# Patient Record
Sex: Female | Born: 2000
Health system: Southern US, Community
[De-identification: ages and names within clinical notes are randomized; demographics above are authoritative.]

## PROBLEM LIST (undated history)

## (undated) ENCOUNTER — Ambulatory Visit (HOSPITAL_COMMUNITY): Discharge status: Against Medical Advice | Payer: Medicaid Other

## (undated) DIAGNOSIS — Z789 Other specified health status: Secondary | ICD-10-CM

## (undated) HISTORY — PX: NO PAST SURGERIES: SHX2092

---
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
## 2020-01-11 ENCOUNTER — Ambulatory Visit (INDEPENDENT_AMBULATORY_CARE_PROVIDER_SITE_OTHER): Payer: Medicaid Other | Admitting: Certified Nurse Midwife

## 2020-01-11 ENCOUNTER — Other Ambulatory Visit: Payer: Self-pay

## 2020-01-11 ENCOUNTER — Encounter: Payer: Self-pay | Admitting: Certified Nurse Midwife

## 2020-01-11 NOTE — Progress Notes (Signed)
Post Partum Visit Note  Stephanie Sloan is a 19 y.o. G32P1001 female who presents for a postpartum visit. She is 4 weeks postpartum following a normal spontaneous vaginal delivery.  I have fully reviewed the prenatal and intrapartum course. The delivery was at 39.6 gestational weeks.  Anesthesia: local. Postpartum course has been doing well. Baby is doing well. Baby is feeding by both breast and bottle - gerber gentle. Bleeding staining only. Bowel function is abnormal: constipation. Bladder function is normal. Patient is not sexually active. Contraception method is POP.   Postpartum depression screening: negative, score 2.   The pregnancy intention screening data noted above was reviewed. Potential methods of contraception were discussed. The patient elected to proceed with Oral Contraceptive.    Edinburgh Postnatal Depression Scale - 01/11/20 1058      Edinburgh Postnatal Depression Scale:  In the Past 7 Days   I have been able to laugh and see the funny side of things. 0    I have looked forward with enjoyment to things. 0    I have blamed myself unnecessarily when things went wrong. 0    I have been anxious or worried for no good reason. 0    I have felt scared or panicky for no good reason. 0    Things have been getting on top of me. 2    I have been so unhappy that I have had difficulty sleeping. 0    I have felt sad or miserable. 0    I have been so unhappy that I have been crying. 0    The thought of harming myself has occurred to me. 0    Edinburgh Postnatal Depression Scale Total 2            The following portions of the patient's history were reviewed and updated as appropriate: allergies, current medications, past medical history, past social history and problem list.  Review of Systems Pertinent items noted in HPI and remainder of comprehensive ROS otherwise negative.    Objective:  BP 120/82   Pulse (!) 108   Wt 136 lb (61.7 kg)   LMP 03/09/2019   BMI 22.63  kg/m    General:  alert, cooperative and no distress   Breasts:  negative  Lungs: clear to auscultation bilaterally  Heart:  regular rate and rhythm  Abdomen: soft, non-tender; bowel sounds normal; no masses,  no organomegaly   Vulva:  not evaluated  Vagina: not evaluated  Cervix:  not evaluated  Corpus: not examined  Adnexa:  not evaluated  Rectal Exam: Not performed.        Assessment:    Normal postpartum exam. Pap smear not done at today's visit. Patient currently on POPs - no complaints or concerns. Discussed with patient once she finishes breastfeeding or milk well established she can call and have it switched to OCP- patient verbalizes understanding.   Plan:   Essential components of care per ACOG recommendations:  1.  Mood and well being: Patient with negative depression screening today. Reviewed local resources for support.  - Patient does not use tobacco.  - hx of drug use? No   2. Infant care and feeding:  -Patient currently breastmilk feeding? Yes  Reviewed importance of draining breast regularly to support lactation. -Social determinants of health (SDOH) reviewed in EPIC. No concerns  3. Sexuality, contraception and birth spacing - Patient does not want a pregnancy in the next year.  Desired family size is 1 children.  -  Reviewed forms of contraception in tiered fashion. Patient is currently taking oral progesterone-only contraceptive   - Discussed birth spacing of 18 months  4. Sleep and fatigue -Encouraged family/partner/community support of 4 hrs of uninterrupted sleep to help with mood and fatigue. Patient reports she has not been sleeping while baby sleeps, encouraged to sleep when baby sleeps and to sleep when she has support and someone watching baby - patient verbalizes understanding   5. Physical Recovery  - Discussed patients delivery and complications - Patient had a 3A degree laceration, perineal healing reviewed. Patient expressed understanding -  Patient has urinary incontinence? No - Patient is not safe to resume physical and sexual activity- encouraged to abstain IC for another 2 weeks while 3A laceration continues to heal    Sharyon Cable, CNM [facility] [employer], [facility]

## 2020-01-11 NOTE — Patient Instructions (Signed)

## 2020-04-04 ENCOUNTER — Encounter: Payer: Self-pay | Admitting: Certified Nurse Midwife

## 2020-10-01 ENCOUNTER — Telehealth: Payer: Self-pay | Admitting: *Deleted

## 2020-10-01 NOTE — Telephone Encounter (Signed)
Received a voice mail from this am stating she missed a call from our office and is confused why we are calling . Per chart do not see that our office has called her. Per chart see that she is [facility] patient. I called Stephanie Sloan and informed her I do not see that we called her but that I see she went to [facility] and maybe they called her. I gave her their phone number. She voices understanding. Stephanie Motl,RN

## 2020-10-16 ENCOUNTER — Ambulatory Visit (HOSPITAL_COMMUNITY)
Admission: EM | Admit: 2020-10-16 | Discharge: 2020-10-16 | Disposition: A | Discharge status: Home / Self Care | Payer: Medicaid Other | Attending: Emergency Medicine | Admitting: Emergency Medicine

## 2020-10-16 ENCOUNTER — Other Ambulatory Visit: Payer: Self-pay

## 2020-10-16 ENCOUNTER — Encounter (HOSPITAL_COMMUNITY): Payer: Self-pay | Admitting: Emergency Medicine

## 2020-10-16 DIAGNOSIS — N898 Other specified noninflammatory disorders of vagina: Secondary | ICD-10-CM | POA: Insufficient documentation

## 2020-10-16 LAB — HIV ANTIBODY (ROUTINE TESTING W REFLEX): HIV Screen 4th Generation wRfx: NONREACTIVE

## 2020-10-16 NOTE — ED Provider Notes (Signed)
[facility]    CSN: [id number] Arrival date & time: 10/16/20  0998      History   Chief Complaint No chief complaint on file.   HPI Stephanie Sloan is a 20 y.o. female.   Patient presents in brown discharge with odor.  Denies itching, irritation, no lesions or rash, urinary frequency, urgency, abdominal pain, flank pain, hematuria.,  Fever, chills.  No known exposure.  Sexually active, 1 partner, no condom use.  Has not attempted treatment of symptoms.  Past Medical History:  Diagnosis Date   Medical history non-contributory     Patient Active Problem List   Diagnosis Date Noted   Gestational hypertension 12/13/2019   Vaginal delivery 12/13/2019   Third degree perineal laceration 12/13/2019   Post term pregnancy at [redacted] weeks gestation 12/12/2019   Genetic carrier 09/12/2019   Sickle cell trait in mother affecting pregnancy ([place]) 06/09/2019   Anemia affecting pregnancy in third trimester 06/01/2019   Supervision of normal pregnancy, antepartum 05/24/2019    Past Surgical History:  Procedure Laterality Date   NO PAST SURGERIES      OB History     Gravida  1   Para  1   Term  1   Preterm  0   AB  0   Living  1      SAB  0   IAB  0   Ectopic  0   Multiple      Live Births  1            Home Medications    Prior to Admission medications   Medication Sig Start Date End Date Taking? Authorizing Provider  acetaminophen (TYLENOL) 325 MG tablet Take 2 tablets (650 mg total) by mouth every 4 (four) hours as needed (for pain scale < 4). 33/82/50   Arrie Senate, MD  amLODipine (NORVASC) 5 MG tablet TAKE 1 TABLET (5 MG TOTAL) BY MOUTH DAILY. Patient not taking: Reported on 10/16/2020 12/14/19 53/97/67  Arrie Senate, MD  Blood Pressure Monitoring (BLOOD PRESSURE KIT) DEVI 1 Device by Does not apply route as needed. 04/25/17   Arrie Senate, MD  ibuprofen (ADVIL) 600 MG tablet Take 1 tablet (600 mg total) by mouth every 6  (six) hours. 37/90/24   Arrie Senate, MD  norethindrone (MICRONOR) 0.35 MG tablet TAKE 1 TABLET (0.35 MG TOTAL) BY MOUTH DAILY. Patient not taking: Reported on 10/16/2020 12/14/19 09/73/53  Arrie Senate, MD  Prenatal Vit-Fe Fumarate-FA (PREPLUS) 27-1 MG TABS Take 1 tablet by mouth daily. Patient not taking: Reported on 10/16/2020 05/08/19   Chancy Milroy, MD    Family History Family History  Problem Relation Age of Onset   Healthy Mother    Healthy Father     Social History Social History   Tobacco Use   Smoking status: Passive Smoke Exposure - Never Smoker   Smokeless tobacco: Never  Vaping Use   Vaping Use: Never used  Substance Use Topics   Alcohol use: Never   Drug use: Never     Allergies   Patient has no known allergies.   Review of Systems Review of Systems  Constitutional: Negative.   Respiratory: Negative.    Cardiovascular: Negative.   Gastrointestinal: Negative.   Genitourinary:  Positive for vaginal discharge. Negative for decreased urine volume, difficulty urinating, dyspareunia, dysuria, enuresis, flank pain, frequency, genital sores, hematuria, menstrual problem, pelvic pain, urgency, vaginal bleeding and vaginal pain.  Skin: Negative.   Neurological:  Negative.     Physical Exam Triage Vital Signs ED Triage Vitals  Enc Vitals Group     BP 10/16/20 1848 121/78     Pulse Rate 10/16/20 1848 (!) 107     Resp --      Temp --      Temp Source 10/16/20 1848 Oral     SpO2 10/16/20 1848 100 %     Weight --      Height --      Head Circumference --      Peak Flow --      Pain Score 10/16/20 1905 0     Pain Loc --      Pain Edu? --      Excl. in [place]? --    No data found.  Updated Vital Signs BP 121/78 (BP Location: Left Arm)   Pulse (!) 107   LMP 10/05/2020   SpO2 100%   Visual Acuity Right Eye Distance:   Left Eye Distance:   Bilateral Distance:    Right Eye Near:   Left Eye Near:    Bilateral Near:     Physical  Exam Constitutional:      Appearance: Normal appearance. She is normal weight.  HENT:     Head: Normocephalic.  Eyes:     Extraocular Movements: Extraocular movements intact.  Pulmonary:     Effort: Pulmonary effort is normal.  Genitourinary:    Comments: Deferred self collected vaginal swab Musculoskeletal:        General: Normal range of motion.  Skin:    General: Skin is warm and dry.  Neurological:     Mental Status: She is alert and oriented to person, place, and time. Mental status is at baseline.  Psychiatric:        Mood and Affect: Mood normal.        Behavior: Behavior normal.     UC Treatments / Results  Labs (all labs ordered are listed, but only abnormal results are displayed) Labs Reviewed  RPR  HIV ANTIBODY (ROUTINE TESTING W REFLEX)  CERVICOVAGINAL ANCILLARY ONLY    EKG   Radiology No results found.  Procedures Procedures (including critical care time)  Medications Ordered in UC Medications - No data to display  Initial Impression / Assessment and Plan / UC Course  I have reviewed the triage vital signs and the nursing notes.  Pertinent labs & imaging results that were available during my care of the patient were reviewed by me and considered in my medical decision making (see chart for details).  Vaginal discharge  1.  STI screening pending, will treat per protocol, advised abstinence until labs results and/or treatment is complete and all symptoms have resolved Final Clinical Impressions(s) / UC Diagnoses   Final diagnoses:  Vaginal discharge     Discharge Instructions      Labs pending 2-3 days, you will be contacted if positive for any sti and treatment will be sent to the pharmacy, you will have to return to the clinic if positive for gonorrhea to receive treatment   Please refrain from having sex until labs results, if positive please refrain from having sex until treatment complete and symptoms resolve   If positive for  HIV,  Syphilis, Chlamydia  gonorrhea or trichomoniasis please notify partner or partners so they may tested as well  Moving forward, it is recommended you use some form of protection against the transmission of sti infections  such as condoms or dental dams with  each sexual encounter     ED Prescriptions   None    PDMP not reviewed this encounter.   Hans Eden, [place] 10/16/20 [phone]

## 2020-10-16 NOTE — ED Triage Notes (Signed)
Patient is in department for sti testing.  Patient reports vaginal discharge and odor.  Patient noticed these symptoms one week ago. Denies back or abdominal pain

## 2020-10-16 NOTE — Discharge Instructions (Addendum)
Labs pending 2-3 days, you will be contacted if positive for any sti and treatment will be sent to the pharmacy, you will have to return to the clinic if positive for gonorrhea to receive treatment   Please refrain from having sex until labs results, if positive please refrain from having sex until treatment complete and symptoms resolve   If positive for HIV, Syphilis, Chlamydia  gonorrhea or trichomoniasis please notify partner or partners so they may tested as well  Moving forward, it is recommended you use some form of protection against the transmission of sti infections  such as condoms or dental dams with each sexual encounter   

## 2020-10-17 LAB — CERVICOVAGINAL ANCILLARY ONLY
Bacterial Vaginitis (gardnerella): POSITIVE — AB
Candida Glabrata: NEGATIVE
Candida Vaginitis: NEGATIVE
Chlamydia: NEGATIVE
Comment: NEGATIVE
Comment: NEGATIVE
Comment: NEGATIVE
Comment: NEGATIVE
Comment: NEGATIVE
Comment: NORMAL
Neisseria Gonorrhea: NEGATIVE
Trichomonas: NEGATIVE

## 2020-10-17 LAB — RPR: RPR Ser Ql: NONREACTIVE

## 2020-10-20 ENCOUNTER — Telehealth (HOSPITAL_COMMUNITY): Payer: Self-pay | Admitting: Emergency Medicine

## 2020-10-20 MED ORDER — METRONIDAZOLE 500 MG PO TABS: 500.0000 mg | ORAL_TABLET | Freq: Two times a day (BID) | ORAL | 0 refills | Status: DC

## 2020-12-05 ENCOUNTER — Emergency Department (HOSPITAL_COMMUNITY): Payer: Medicaid Other

## 2020-12-05 ENCOUNTER — Emergency Department (HOSPITAL_COMMUNITY)
Admission: EM | Admit: 2020-12-05 | Discharge: 2020-12-05 | Disposition: A | Discharge status: Home / Self Care | Payer: Medicaid Other | Attending: Emergency Medicine | Admitting: Emergency Medicine

## 2020-12-05 ENCOUNTER — Encounter (HOSPITAL_COMMUNITY): Payer: Self-pay

## 2020-12-05 ENCOUNTER — Other Ambulatory Visit: Payer: Self-pay

## 2020-12-05 DIAGNOSIS — N9489 Other specified conditions associated with female genital organs and menstrual cycle: Secondary | ICD-10-CM | POA: Diagnosis not present

## 2020-12-05 DIAGNOSIS — Z7722 Contact with and (suspected) exposure to environmental tobacco smoke (acute) (chronic): Secondary | ICD-10-CM | POA: Diagnosis not present

## 2020-12-05 DIAGNOSIS — Z79899 Other long term (current) drug therapy: Secondary | ICD-10-CM | POA: Insufficient documentation

## 2020-12-05 DIAGNOSIS — R7989 Other specified abnormal findings of blood chemistry: Secondary | ICD-10-CM | POA: Diagnosis not present

## 2020-12-05 DIAGNOSIS — R000 Tachycardia, unspecified: Secondary | ICD-10-CM | POA: Insufficient documentation

## 2020-12-05 DIAGNOSIS — R0600 Dyspnea, unspecified: Secondary | ICD-10-CM | POA: Diagnosis not present

## 2020-12-05 DIAGNOSIS — D649 Anemia, unspecified: Secondary | ICD-10-CM | POA: Insufficient documentation

## 2020-12-05 DIAGNOSIS — R079 Chest pain, unspecified: Secondary | ICD-10-CM | POA: Diagnosis not present

## 2020-12-05 DIAGNOSIS — R0789 Other chest pain: Secondary | ICD-10-CM | POA: Diagnosis not present

## 2020-12-05 LAB — BASIC METABOLIC PANEL
Anion gap: 7 (ref 5–15)
BUN: 8 mg/dL (ref 6–20)
CO2: 24 mmol/L (ref 22–32)
Calcium: 9.3 mg/dL (ref 8.9–10.3)
Chloride: 106 mmol/L (ref 98–111)
Creatinine, Ser: 0.77 mg/dL (ref 0.44–1.00)
GFR, Estimated: >60 mL/min (ref >60)
Glucose, Bld: 103 mg/dL — ABNORMAL HIGH (ref 70–99)
Potassium: 3.6 mmol/L (ref 3.5–5.1)
Sodium: 137 mmol/L (ref 135–145)

## 2020-12-05 LAB — CBC WITH DIFFERENTIAL/PLATELET
Abs Immature Granulocytes: 0.01 10*3/uL (ref 0.00–0.07)
Basophils Absolute: 0 10*3/uL (ref 0.0–0.1)
Basophils Relative: 1 %
Eosinophils Absolute: 0.1 10*3/uL (ref 0.0–0.5)
Eosinophils Relative: 1 %
HCT: 29.6 % — ABNORMAL LOW (ref 36.0–46.0)
Hemoglobin: 8.7 g/dL — ABNORMAL LOW (ref 12.0–15.0)
Immature Granulocytes: 0 %
Lymphocytes Relative: 39 %
Lymphs Abs: 2.4 10*3/uL (ref 0.7–4.0)
MCH: 22.2 pg — ABNORMAL LOW (ref 26.0–34.0)
MCHC: 29.4 g/dL — ABNORMAL LOW (ref 30.0–36.0)
MCV: 75.5 fL — ABNORMAL LOW (ref 80.0–100.0)
Monocytes Absolute: 0.6 10*3/uL (ref 0.1–1.0)
Monocytes Relative: 9 %
Neutro Abs: 3.1 10*3/uL (ref 1.7–7.7)
Neutrophils Relative %: 50 %
Platelets: 414 10*3/uL — ABNORMAL HIGH (ref 150–400)
RBC: 3.92 MIL/uL (ref 3.87–5.11)
RDW: 18.1 % — ABNORMAL HIGH (ref 11.5–15.5)
WBC: 6.2 10*3/uL (ref 4.0–10.5)
nRBC: 0 % (ref 0.0–0.2)

## 2020-12-05 LAB — TROPONIN I (HIGH SENSITIVITY)
Troponin I (High Sensitivity): <2 ng/L (ref <18)
Troponin I (High Sensitivity): <2 ng/L (ref <18)

## 2020-12-05 LAB — I-STAT BETA HCG BLOOD, ED (MC, WL, AP ONLY): I-stat hCG, quantitative: <5 m[IU]/mL (ref <5)

## 2020-12-05 LAB — D-DIMER, QUANTITATIVE: D-Dimer, Quant: 0.64 ug/mL-FEU — ABNORMAL HIGH (ref 0.00–0.50)

## 2020-12-05 MED ORDER — IOHEXOL 350 MG/ML SOLN
65.0000 mL | Freq: Once | INTRAVENOUS | Status: AC | PRN
  Administered 2020-12-05: 65 mL via INTRAVENOUS

## 2020-12-05 MED ORDER — FERROUS SULFATE 325 (65 FE) MG PO TABS: 325.0000 mg | ORAL_TABLET | Freq: Every day | ORAL | 0 refills | Status: AC

## 2020-12-05 NOTE — ED Provider Notes (Signed)
Emergency Medicine Provider Triage Evaluation Note  Stephanie Sloan , a 20 y.o. female  was evaluated in triage.  Pt complains of left sided chest pain for about 4-5 days.  States breathing seems "off".  Denies cough, fever, sick contacts.  No cardiac history.  Denies breast lumps/masses or nipple discharge.  Review of Systems  Positive: Chest pain, SOB Negative: Fever, cough  Physical Exam  BP (!) 130/94 (BP Location: Right Arm)   Pulse 95   Temp 98.8 F (37.1 C) (Oral)   Resp 17   SpO2 99%   Gen:   Awake, no distress   Resp:  Normal effort  MSK:   Moves extremities without difficulty  Other:    Medical Decision Making  Medically screening exam initiated at 3:40 AM.  Appropriate orders placed.  Stephanie Sloan was informed that the remainder of the evaluation will be completed by another provider, this initial triage assessment does not replace that evaluation, and the importance of remaining in the ED until their evaluation is complete.  Chest pain.  No cardiac history.  VSS.  EKG, labs, CXR.   Garlon Hatchet, PA-C 12/05/20 [id number]    Geoffery Lyons, MD 12/05/20 [phone]

## 2020-12-05 NOTE — ED Triage Notes (Signed)
Pt states that she has been having CP for the past 4-5 days, with some SOB

## 2020-12-05 NOTE — Discharge Instructions (Addendum)
Pick up iron supplementation and take as prescribed. You were found to be anemic today which may be contributing to your symptoms.   Please follow up with your PCP regarding ED visit today and have your hemoglobin level rechecked next week. If you do not have a PCP you can follow up with [facility] for primary care needs.   Return to the ED IMMEDIATELY for any new/worsening symptoms

## 2020-12-05 NOTE — ED Provider Notes (Signed)
[place] EMERGENCY DEPARTMENT Provider Note   CSN: [id number] Arrival date & time: 12/05/20  0326     History Chief Complaint  Patient presents with   Chest Pain    Stephanie Sloan is a 20 y.o. female who presents to the ED today with complaint of gradual onset, constant, "weird" feeling to left chest with deep inspiration for the past 1 week.  Patient denies specific pain however she is unable to describe the feeling.  She does note that it seems off to her typical breathing.  She denies any other symptoms including fevers, chills, cough, body aches, leg swelling/pain, abdominal pain, nausea vomiting, diaphoresis, any other associated symptoms.  She does report history of anemia requiring blood transfusion when she was pregnant last year.  She is currently on her menstrual cycle.  She denies any heavy vaginal bleeding however.  She denies any melena or blood in stool.  Per chart review patient on OCPs however she states she took her self off of this several months ago.   The history is provided by the patient and medical records.      Past Medical History:  Diagnosis Date   Medical history non-contributory     Patient Active Problem List   Diagnosis Date Noted   Gestational hypertension 12/13/2019   Vaginal delivery 12/13/2019   Third degree perineal laceration 12/13/2019   Post term pregnancy at [redacted] weeks gestation 12/12/2019   Genetic carrier 09/12/2019   Sickle cell trait in mother affecting pregnancy ([place]) 06/09/2019   Anemia affecting pregnancy in third trimester 06/01/2019   Supervision of normal pregnancy, antepartum 05/24/2019    Past Surgical History:  Procedure Laterality Date   NO PAST SURGERIES       OB History     Gravida  1   Para  1   Term  1   Preterm  0   AB  0   Living  1      SAB  0   IAB  0   Ectopic  0   Multiple      Live Births  1           Family History  Problem Relation Age of Onset   Healthy  Mother    Healthy Father     Social History   Tobacco Use   Smoking status: Passive Smoke Exposure - Never Smoker   Smokeless tobacco: Never  Vaping Use   Vaping Use: Never used  Substance Use Topics   Alcohol use: Never   Drug use: Never    Home Medications Prior to Admission medications   Medication Sig Start Date End Date Taking? Authorizing Provider  ferrous sulfate 325 (65 FE) MG tablet Take 1 tablet (325 mg total) by mouth daily. 12/05/20  Yes Tariq Pernell, PA-C  acetaminophen (TYLENOL) 325 MG tablet Take 2 tablets (650 mg total) by mouth every 4 (four) hours as needed (for pain scale < 4). Patient not taking: Reported on 12/05/2020 09/40/76   Arrie Senate, MD  amLODipine (NORVASC) 5 MG tablet TAKE 1 TABLET (5 MG TOTAL) BY MOUTH DAILY. Patient not taking: No sig reported 12/14/19 80/88/11  Arrie Senate, MD  Blood Pressure Monitoring (BLOOD PRESSURE KIT) DEVI 1 Device by Does not apply route as needed. 0/3/15   Arrie Senate, MD  ibuprofen (ADVIL) 600 MG tablet Take 1 tablet (600 mg total) by mouth every 6 (six) hours. Patient not taking: Reported on 12/05/2020 12/14/19  Arrie Senate, MD  metroNIDAZOLE (FLAGYL) 500 MG tablet Take 1 tablet (500 mg total) by mouth 2 (two) times daily. Patient not taking: Reported on 12/05/2020 10/20/20   Chase Picket, MD  norethindrone (MICRONOR) 0.35 MG tablet TAKE 1 TABLET (0.35 MG TOTAL) BY MOUTH DAILY. Patient not taking: No sig reported 12/14/19 57/90/38  Arrie Senate, MD  Prenatal Vit-Fe Fumarate-FA (PREPLUS) 27-1 MG TABS Take 1 tablet by mouth daily. Patient not taking: No sig reported 05/08/19   Chancy Milroy, MD    Allergies    Patient has no known allergies.  Review of Systems   Review of Systems  Constitutional:  Negative for chills and fever.  Respiratory:  Positive for shortness of breath. Negative for cough.   Cardiovascular:  Negative for chest pain.  Gastrointestinal:  Negative  for nausea and vomiting.  All other systems reviewed and are negative.  Physical Exam Updated Vital Signs BP 125/79   Pulse 96   Temp 98.8 F (37.1 C) (Oral)   Resp 16   SpO2 100%   Physical Exam Vitals and nursing note reviewed.  Constitutional:      Appearance: She is not ill-appearing or diaphoretic.  HENT:     Head: Normocephalic and atraumatic.  Eyes:     Conjunctiva/sclera: Conjunctivae normal.  Cardiovascular:     Rate and Rhythm: Normal rate and regular rhythm.     Pulses:          Radial pulses are 2+ on the right side and 2+ on the left side.     Heart sounds: Normal heart sounds.  Pulmonary:     Effort: Pulmonary effort is normal.     Breath sounds: Normal breath sounds. No decreased breath sounds, wheezing, rhonchi or rales.  Chest:     Chest wall: No tenderness.  Abdominal:     Palpations: Abdomen is soft.     Tenderness: There is no abdominal tenderness. There is no guarding or rebound.  Musculoskeletal:     Cervical back: Neck supple.     Right lower leg: No edema.     Left lower leg: No edema.  Skin:    General: Skin is warm and dry.  Neurological:     Mental Status: She is alert.    ED Results / Procedures / Treatments   Labs (all labs ordered are listed, but only abnormal results are displayed) Labs Reviewed  CBC WITH DIFFERENTIAL/PLATELET - Abnormal; Notable for the following components:      Result Value   Hemoglobin 8.7 (*)    HCT 29.6 (*)    MCV 75.5 (*)    MCH 22.2 (*)    MCHC 29.4 (*)    RDW 18.1 (*)    Platelets 414 (*)    All other components within normal limits  BASIC METABOLIC PANEL - Abnormal; Notable for the following components:   Glucose, Bld 103 (*)    All other components within normal limits  D-DIMER, QUANTITATIVE - Abnormal; Notable for the following components:   D-Dimer, Quant 0.64 (*)    All other components within normal limits  I-STAT BETA HCG BLOOD, ED ([facility], [facility], [facility] ONLY)  TROPONIN I (HIGH SENSITIVITY)  TROPONIN  I (HIGH SENSITIVITY)    EKG None  Radiology DG Chest 2 View  Result Date: 12/05/2020 CLINICAL DATA:  Chest pain, dyspnea EXAM: CHEST - 2 VIEW COMPARISON:  None. FINDINGS: The heart size and mediastinal contours are within normal limits. Both lungs are clear. The  visualized skeletal structures are unremarkable. IMPRESSION: No active cardiopulmonary disease. Electronically Signed   By: Fidela Salisbury M.D.   On: 12/05/2020 04:03   CT Angio Chest PE W/Cm &/Or Wo Cm  Result Date: 12/05/2020 CLINICAL DATA:  Pulmonary embolism suspected, low/intermediate probability. Positive D-dimer levels. EXAM: CT ANGIOGRAPHY CHEST WITH CONTRAST TECHNIQUE: Multidetector CT imaging of the chest was performed using the standard protocol during bolus administration of intravenous contrast. Multiplanar CT image reconstructions and MIPs were obtained to evaluate the vascular anatomy. CONTRAST:  49m OMNIPAQUE IOHEXOL 350 MG/ML SOLN COMPARISON:  Radiographs 12/05/2020. FINDINGS: Cardiovascular: The pulmonary arteries are well opacified with contrast to the level of the subsegmental branches. There is no evidence of acute pulmonary embolism. No systemic arterial abnormalities are identified. The heart size is normal. There is no pericardial effusion. Mediastinum/Nodes: There are no enlarged mediastinal, hilar or axillary lymph nodes.A small amount of residual thymic tissue is present within the anterior mediastinum. The thyroid gland, trachea and esophagus demonstrate no significant findings. Lungs/Pleura: No pleural effusion or pneumothorax. There is a 4 mm right lower lobe nodule on image 78/6, likely a lymph node or other benign finding in this 20year old. This requires no specific follow-. The lungs are otherwise clear. Upper abdomen:  Unremarkable.  There is no adrenal mass. Musculoskeletal/Chest wall: There is no chest wall mass or suspicious osseous finding. Review of the MIP images confirms the above findings.  IMPRESSION: 1. No evidence of acute pulmonary embolism or other acute chest process. Electronically Signed   By: WRichardean SaleM.D.   On: 12/05/2020 13:22    Procedures Procedures   Medications Ordered in ED Medications  iohexol (OMNIPAQUE) 350 MG/ML injection 65 mL (65 mLs Intravenous Contrast Given 12/05/20 1253)    ED Course  I have reviewed the triage vital signs and the nursing notes.  Pertinent labs & imaging results that were available during my care of the patient were reviewed by me and considered in my medical decision making (see chart for details).     MDM Rules/Calculators/A&P                           20year old female presents to the ED today with complaint of "off" feeling with deep inspiration to her left chest for the past week.  On arrival to the ED today vitals are stable.  Patient was medically screened and work-up started including 2 negative troponins less than 2.  Chest x-ray clear.  CBC with a hemoglobin of 8.7.  Patient with history of anemia during pregnancy last year requiring blood transfusion.  Is currently on her menstrual cycle.  She denies any melena or bright red blood per rectum.  She is in the hallway and therefore unable to properly obtain fecal sample however given young age with history of blood transfusion secondary to pregnancy I have lower suspicion that she is having a GI bleed.  She will ultimately need to follow-up with OB/GYN for repeat hemoglobin.  May consider placing patient on iron supplements.  EKG was noted to have sinus tachycardia with a rate of 107 however no vital signs documented with tachycardia.  On my exam patient is resting comfortably, able to speak in full sentences without difficulty.  Does not appear to be in respiratory distress.  She has no chest wall tenderness palpation on exam.  No leg swelling.  It does appear she was on OCPs in the past however took her self off several  months ago.  She is a non-smoker.  We will add on  D-dimer at this time.  If positive will need to proceed with CTA however if negative she is low well score.   D dimer elevated at 0.64; will proceed with CTA at this time.   CTA negative for acute findings Will discharge home at this time with PCP follow up. Pt in agreement with plan. Strict return precautions have been discussed.   This note was prepared using Dragon voice recognition software and may include unintentional dictation errors due to the inherent limitations of voice recognition software.   Final Clinical Impression(s) / ED Diagnoses Final diagnoses:  Nonspecific chest pain  Anemia, unspecified type    Rx / DC Orders ED Discharge Orders          Ordered    ferrous sulfate 325 (65 FE) MG tablet  Daily        12/05/20 1407             Discharge Instructions      Pick up iron supplementation and take as prescribed. You were found to be anemic today which may be contributing to your symptoms.   Please follow up with your PCP regarding ED visit today and have your hemoglobin level rechecked next week. If you do not have a PCP you can follow up with [facility] for primary care needs.   Return to the ED IMMEDIATELY for any new/worsening symptoms       Eustaquio Maize, PA-C 12/05/20 1408    Fredia Sorrow, MD 12/06/20 1511

## 2020-12-06 ENCOUNTER — Ambulatory Visit (HOSPITAL_COMMUNITY): Admit: 2020-12-06 | Discharge status: Against Medical Advice | Payer: Medicaid Other

## 2020-12-08 ENCOUNTER — Telehealth: Payer: Self-pay

## 2020-12-08 NOTE — Telephone Encounter (Signed)
Transition Care Management Unsuccessful Follow-up Telephone Call  Date of discharge and from where:  12/05/2020 [place]  Attempts:  1st Attempt  Reason for unsuccessful TCM follow-up call:  Left voice message    

## 2020-12-09 NOTE — Telephone Encounter (Signed)
Transition Care Management Unsuccessful Follow-up Telephone Call  Date of discharge and from where:  12/05/2020[facility]  Attempts:  2nd Attempt  Reason for unsuccessful TCM follow-up call:  Left voice message

## 2020-12-10 NOTE — Telephone Encounter (Signed)
Transition Care Management Unsuccessful Follow-up Telephone Call  Date of discharge and from where:  12/05/2020[facility]  Attempts:  3rd Attempt  Reason for unsuccessful TCM follow-up call:  Left voice message

## 2021-02-27 ENCOUNTER — Other Ambulatory Visit: Payer: Self-pay

## 2021-02-27 ENCOUNTER — Encounter (HOSPITAL_COMMUNITY): Payer: Self-pay

## 2021-02-27 ENCOUNTER — Ambulatory Visit (HOSPITAL_COMMUNITY)
Admission: EM | Admit: 2021-02-27 | Discharge: 2021-02-27 | Disposition: A | Discharge status: Home / Self Care | Payer: Medicaid Other | Attending: Emergency Medicine | Admitting: Emergency Medicine

## 2021-02-27 DIAGNOSIS — N898 Other specified noninflammatory disorders of vagina: Secondary | ICD-10-CM | POA: Diagnosis not present

## 2021-02-27 MED ORDER — METRONIDAZOLE 500 MG PO TABS: 500.0000 mg | ORAL_TABLET | Freq: Two times a day (BID) | ORAL | 0 refills | Status: DC

## 2021-02-27 NOTE — ED Triage Notes (Signed)
Pt presents to the office for vaginal discharge x 2-3 days. Last period was 02/25/2020

## 2021-02-27 NOTE — ED Provider Notes (Signed)
[place]    CSN: [id number] Arrival date & time: 02/27/21  4917      History   Chief Complaint Chief Complaint  Patient presents with   Vaginal Discharge    HPI Stephanie Sloan is a 21 y.o. female.   Patient presents with thin Stephanie Sloan discharge with odor for 1 week. associated vaginal irritation.  Sexually active, 1 partner, no condoms. No known exposure.  Denies urinary frequency, urgency, hematuria, dysuria, abdominal pain, flank pain, fever, chills.  History of bacterial vaginosis.  Past Medical History:  Diagnosis Date   Medical history non-contributory     Patient Active Problem List   Diagnosis Date Noted   Gestational hypertension 12/13/2019   Vaginal delivery 12/13/2019   Third degree perineal laceration 12/13/2019   Post term pregnancy at [redacted] weeks gestation 12/12/2019   Genetic carrier 09/12/2019   Sickle cell trait in mother affecting pregnancy ([place]) 06/09/2019   Anemia affecting pregnancy in third trimester 06/01/2019   Supervision of normal pregnancy, antepartum 05/24/2019    Past Surgical History:  Procedure Laterality Date   NO PAST SURGERIES      OB History     Gravida  1   Para  1   Term  1   Preterm  0   AB  0   Living  1      SAB  0   IAB  0   Ectopic  0   Multiple      Live Births  1            Home Medications    Prior to Admission medications   Medication Sig Start Date End Date Taking? Authorizing Provider  acetaminophen (TYLENOL) 325 MG tablet Take 2 tablets (650 mg total) by mouth every 4 (four) hours as needed (for pain scale < 4). Patient not taking: Reported on 12/05/2020 91/50/56   Arrie Senate, MD  amLODipine (NORVASC) 5 MG tablet TAKE 1 TABLET (5 MG TOTAL) BY MOUTH DAILY. Patient not taking: No sig reported 12/14/19 97/94/80  Arrie Senate, MD  Blood Pressure Monitoring (BLOOD PRESSURE KIT) DEVI 1 Device by Does not apply route as needed. 02/27/53   Arrie Senate, MD  ferrous  sulfate 325 (65 FE) MG tablet Take 1 tablet (325 mg total) by mouth daily. 12/05/20   Alroy Bailiff, Marg[place], PA-C  ibuprofen (ADVIL) 600 MG tablet Take 1 tablet (600 mg total) by mouth every 6 (six) hours. Patient not taking: Reported on 12/05/2020 37/48/27   Arrie Senate, MD  metroNIDAZOLE (FLAGYL) 500 MG tablet Take 1 tablet (500 mg total) by mouth 2 (two) times daily. Patient not taking: Reported on 12/05/2020 10/20/20   Chase Picket, MD  norethindrone (MICRONOR) 0.35 MG tablet TAKE 1 TABLET (0.35 MG TOTAL) BY MOUTH DAILY. Patient not taking: No sig reported 12/14/19 07/86/75  Arrie Senate, MD  Prenatal Vit-Fe Fumarate-FA (PREPLUS) 27-1 MG TABS Take 1 tablet by mouth daily. Patient not taking: No sig reported 05/08/19   Chancy Milroy, MD    Family History Family History  Problem Relation Age of Onset   Healthy Mother    Healthy Father     Social History Social History   Tobacco Use   Smoking status: Passive Smoke Exposure - Never Smoker   Smokeless tobacco: Never  Vaping Use   Vaping Use: Never used  Substance Use Topics   Alcohol use: Never   Drug use: Never     Allergies  Patient has no known allergies.   Review of Systems Review of Systems  Constitutional: Negative.   Respiratory: Negative.    Cardiovascular: Negative.   Gastrointestinal:  Positive for abdominal pain. Negative for abdominal distention, anal bleeding, blood in stool, constipation, diarrhea, nausea, rectal pain and vomiting.  Genitourinary:  Positive for vaginal discharge. Negative for decreased urine volume, difficulty urinating, dyspareunia, dysuria, enuresis, flank pain, frequency, genital sores, hematuria, menstrual problem, pelvic pain, urgency, vaginal bleeding and vaginal pain.  Skin: Negative.     Physical Exam Triage Vital Signs ED Triage Vitals [02/27/21 0919]  Enc Vitals Group     BP 126/81     Pulse Rate 88     Resp 16     Temp 98.3 F (36.8 C)     Temp src       SpO2 99 %     Weight      Height      Head Circumference      Peak Flow      Pain Score      Pain Loc      Pain Edu?      Excl. in [place]?    No data found.  Updated Vital Signs BP 126/81 (BP Location: Right Arm)    Pulse 88    Temp 98.3 F (36.8 C)    Resp 16    LMP 02/25/2020 (Approximate)    SpO2 99%   Visual Acuity Right Eye Distance:   Left Eye Distance:   Bilateral Distance:    Right Eye Near:   Left Eye Near:    Bilateral Near:     Physical Exam Constitutional:      Appearance: Normal appearance.  HENT:     Head: Normocephalic.  Eyes:     Extraocular Movements: Extraocular movements intact.  Pulmonary:     Effort: Pulmonary effort is normal.  Genitourinary:    Comments: Deferred self collect vaginal swab Neurological:     Mental Status: She is alert and oriented to person, place, and time. Mental status is at baseline.  Psychiatric:        Mood and Affect: Mood normal.        Behavior: Behavior normal.     UC Treatments / Results  Labs (all labs ordered are listed, but only abnormal results are displayed) Labs Reviewed  CERVICOVAGINAL ANCILLARY ONLY    EKG   Radiology No results found.  Procedures Procedures (including critical care time)  Medications Ordered in UC Medications - No data to display  Initial Impression / Assessment and Plan / UC Course  I have reviewed the triage vital signs and the nursing notes.  Pertinent labs & imaging results that were available during my care of the patient were reviewed by me and considered in my medical decision making (see chart for details).  Vaginal discharge  Will prophylactically treat for bacterial vaginosis based on symptomology and history, metronidazole 7-day course sent to pharmacy, advised abstinence from alcohol while using medication, STI labs pending, will treat per protocol, advised abstinence until all medications for sleep and all symptoms have resolved, urgent care follow-up as  needed Final Clinical Impressions(s) / UC Diagnoses   Final diagnoses:  None   Discharge Instructions   None    ED Prescriptions   None    PDMP not reviewed this encounter.   Hans Eden, [place] 02/27/21 [phone]

## 2021-02-27 NOTE — Discharge Instructions (Signed)
Today you are being treated prophylactically for  Bacterial vaginosis   Take Metronidazole 500 mg twice a day for 7 days, do not drink alcohol while using medication, this will make you feel sick   Bacterial vaginosis which results from an overgrowth of one on several organisms that are normally present in your vagina. Vaginosis is an inflammation of the vagina that can result in discharge, itching and pain.  Labs pending 2-3 days, you will be contacted if positive for any sti and treatment will be sent to the pharmacy, you will have to return to the clinic if positive for gonorrhea to receive treatment   Please refrain from having sex until labs results, if positive please refrain from having sex until treatment complete and symptoms resolve   If positive for  Chlamydia  gonorrhea or trichomoniasis please notify partner or partners so they may tested as well  Moving forward, it is recommended you use some form of protection against the transmission of sti infections  such as condoms or dental dams with each sexual encounter     In addition: Avoid baths, hot tubs and whirlpool spas.  Don't use scented or harsh soaps Avoid irritants. These include scented tampons and pads. Wipe from front to back after using the toilet. Don't douche. Your vagina doesn't require cleansing other than normal bathing.  Use a condom.  Wear cotton underwear, this fabric absorbs some moisture.      

## 2021-02-28 LAB — CERVICOVAGINAL ANCILLARY ONLY
Bacterial Vaginitis (gardnerella): POSITIVE — AB
Candida Glabrata: NEGATIVE
Candida Vaginitis: NEGATIVE
Chlamydia: NEGATIVE
Comment: NEGATIVE
Comment: NEGATIVE
Comment: NEGATIVE
Comment: NEGATIVE
Comment: NEGATIVE
Comment: NORMAL
Neisseria Gonorrhea: NEGATIVE
Trichomonas: NEGATIVE

## 2021-03-23 ENCOUNTER — Ambulatory Visit (HOSPITAL_COMMUNITY): Payer: Self-pay

## 2021-04-02 ENCOUNTER — Ambulatory Visit (HOSPITAL_COMMUNITY)
Admission: EM | Admit: 2021-04-02 | Discharge: 2021-04-02 | Disposition: A | Discharge status: Home / Self Care | Payer: Medicaid Other | Attending: Urgent Care | Admitting: Urgent Care

## 2021-04-02 ENCOUNTER — Encounter (HOSPITAL_COMMUNITY): Payer: Self-pay

## 2021-04-02 ENCOUNTER — Other Ambulatory Visit: Payer: Self-pay

## 2021-04-02 DIAGNOSIS — N898 Other specified noninflammatory disorders of vagina: Secondary | ICD-10-CM | POA: Diagnosis not present

## 2021-04-02 DIAGNOSIS — Z113 Encounter for screening for infections with a predominantly sexual mode of transmission: Secondary | ICD-10-CM | POA: Diagnosis not present

## 2021-04-02 LAB — POCT URINALYSIS DIPSTICK, ED / UC
Bilirubin Urine: NEGATIVE
Glucose, UA: NEGATIVE mg/dL
Ketones, ur: NEGATIVE mg/dL
Nitrite: NEGATIVE
Protein, ur: 30 mg/dL — AB
Specific Gravity, Urine: 1.025 (ref 1.005–1.030)
Urobilinogen, UA: 0.2 mg/dL (ref 0.0–1.0)
pH: 5.5 (ref 5.0–8.0)

## 2021-04-02 LAB — POC URINE PREG, ED: Preg Test, Ur: NEGATIVE

## 2021-04-02 MED ORDER — FLUCONAZOLE 150 MG PO TABS: ORAL_TABLET | ORAL | 0 refills | Status: DC

## 2021-04-02 NOTE — ED Triage Notes (Signed)
Pt would like STI testing.  She c/o vaginal discharge for several days.

## 2021-04-02 NOTE — ED Provider Notes (Signed)
[place]    CSN: [id number] Arrival date & time: 04/02/21  0825      History   Chief Complaint Chief Complaint  Patient presents with   Exposure to STD    HPI Stephanie Sloan is a 21 y.o. female.   Pleasant 21 year old female presents today with concerns of possible STI.  She denies any known exposures but states for the past 2 days she has had extensive vaginal itching.  She states it feels both internal and external.  She states there is some white vaginal discharge, but denies any odor.  She denies any pelvic pain.  She denies any hematuria or dysuria.  She denies any frequency.  She denies any flank pain or fever.  No abdominal symptoms.  She has not tried any over-the-counter medications.   Exposure to STD   Past Medical History:  Diagnosis Date   Medical history non-contributory     Patient Active Problem List   Diagnosis Date Noted   Gestational hypertension 12/13/2019   Vaginal delivery 12/13/2019   Third degree perineal laceration 12/13/2019   Post term pregnancy at [redacted] weeks gestation 12/12/2019   Genetic carrier 09/12/2019   Sickle cell trait in mother affecting pregnancy ([place]) 06/09/2019   Anemia affecting pregnancy in third trimester 06/01/2019   Supervision of normal pregnancy, antepartum 05/24/2019    Past Surgical History:  Procedure Laterality Date   NO PAST SURGERIES      OB History     Gravida  1   Para  1   Term  1   Preterm  0   AB  0   Living  1      SAB  0   IAB  0   Ectopic  0   Multiple      Live Births  1            Home Medications    Prior to Admission medications   Medication Sig Start Date End Date Taking? Authorizing Provider  fluconazole (DIFLUCAN) 150 MG tablet Take one tab PO x1 dose now. May repeat in 3 days if needed. 04/02/21  Yes Azalynn Maxim L, PA  acetaminophen (TYLENOL) 325 MG tablet Take 2 tablets (650 mg total) by mouth every 4 (four) hours as needed (for pain scale <  4). Patient not taking: Reported on 12/05/2020 40/98/11   Arrie Senate, MD  amLODipine (NORVASC) 5 MG tablet TAKE 1 TABLET (5 MG TOTAL) BY MOUTH DAILY. Patient not taking: No sig reported 12/14/19 91/47/82  Arrie Senate, MD  Blood Pressure Monitoring (BLOOD PRESSURE KIT) DEVI 1 Device by Does not apply route as needed. 10/27/60   Arrie Senate, MD  ferrous sulfate 325 (65 FE) MG tablet Take 1 tablet (325 mg total) by mouth daily. 12/05/20   Alroy Bailiff, Margaux, PA-C  ibuprofen (ADVIL) 600 MG tablet Take 1 tablet (600 mg total) by mouth every 6 (six) hours. Patient not taking: Reported on 12/05/2020 13/08/65   Arrie Senate, MD  metroNIDAZOLE (FLAGYL) 500 MG tablet Take 1 tablet (500 mg total) by mouth 2 (two) times daily. 02/27/21   White, Leitha Schuller, NP  norethindrone (MICRONOR) 0.35 MG tablet TAKE 1 TABLET (0.35 MG TOTAL) BY MOUTH DAILY. Patient not taking: No sig reported 12/14/19 78/46/96  Arrie Senate, MD  Prenatal Vit-Fe Fumarate-FA (PREPLUS) 27-1 MG TABS Take 1 tablet by mouth daily. Patient not taking: No sig reported 05/08/19   Chancy Milroy, MD    Family  History Family History  Problem Relation Age of Onset   Healthy Mother    Healthy Father     Social History Social History   Tobacco Use   Smoking status: Passive Smoke Exposure - Never Smoker   Smokeless tobacco: Never  Vaping Use   Vaping Use: Never used  Substance Use Topics   Alcohol use: Never   Drug use: Never     Allergies   Patient has no known allergies.   Review of Systems Review of Systems  Genitourinary:  Positive for vaginal discharge.  All other systems reviewed and are negative.   Physical Exam Triage Vital Signs ED Triage Vitals  Enc Vitals Group     BP 04/02/21 0905 117/76     Pulse Rate 04/02/21 0905 88     Resp 04/02/21 0905 16     Temp 04/02/21 0905 99.3 F (37.4 C)     Temp Source 04/02/21 0905 Oral     SpO2 04/02/21 0905 100 %     Weight --       Height --      Head Circumference --      Peak Flow --      Pain Score 04/02/21 1022 0     Pain Loc --      Pain Edu? --      Excl. in [place]? --    No data found.  Updated Vital Signs BP 117/76 (BP Location: Left Arm)    Pulse 88    Temp 99.3 F (37.4 C) (Oral)    Resp 16    LMP 03/02/2021 (Approximate)    SpO2 100%   Visual Acuity Right Eye Distance:   Left Eye Distance:   Bilateral Distance:    Right Eye Near:   Left Eye Near:    Bilateral Near:     Physical Exam Vitals and nursing note reviewed. Chaperone present: offered, declined.  Constitutional:      General: She is not in acute distress.    Appearance: Normal appearance. She is well-developed and normal weight. She is not ill-appearing, toxic-appearing or diaphoretic.  HENT:     Head: Normocephalic.  Eyes:     Conjunctiva/sclera: Conjunctivae normal.     Pupils: Pupils are equal, round, and reactive to light.  Cardiovascular:     Rate and Rhythm: Normal rate.     Heart sounds: No murmur heard. Pulmonary:     Effort: Pulmonary effort is normal. No respiratory distress.     Breath sounds: No wheezing.  Abdominal:     General: Abdomen is flat. Bowel sounds are normal. There is no distension. There are no signs of injury.     Palpations: Abdomen is soft. There is no shifting dullness, fluid wave, hepatomegaly, splenomegaly, mass or pulsatile mass.     Tenderness: There is no abdominal tenderness. There is no right CVA tenderness, left CVA tenderness, guarding or rebound. Negative signs include Murphy's sign, Rovsing's sign, McBurney's sign, psoas sign and obturator sign.     Hernia: No hernia is present.  Genitourinary:    Pubic Area: No rash or pubic lice.      Labia:        Right: No rash, tenderness, lesion or injury.        Left: No rash, tenderness, lesion or injury.      Urethra: No prolapse, urethral pain, urethral swelling or urethral lesion.     Vagina: No signs of injury and foreign body. No erythema,  tenderness, bleeding,  lesions or prolapsed vaginal walls.     Cervix: No cervical motion tenderness, discharge, friability, lesion, erythema or eversion.     Uterus: Normal. Not deviated, not enlarged, not fixed, not tender and no uterine prolapse.      Adnexa: Right adnexa normal and left adnexa normal.       Right: No mass, tenderness or fullness.         Left: No mass, tenderness or fullness.       Comments: Internal exam refused. External reveals no rash, swelling, discharge or irritation Musculoskeletal:        General: No swelling.     Cervical back: Normal range of motion.  Skin:    General: Skin is warm.     Capillary Refill: Capillary refill takes less than 2 seconds.     Coloration: Skin is not jaundiced.     Findings: No bruising, erythema or rash.  Neurological:     General: No focal deficit present.     Mental Status: She is alert.  Psychiatric:        Mood and Affect: Mood normal.     UC Treatments / Results  Labs (all labs ordered are listed, but only abnormal results are displayed) Labs Reviewed  POCT URINALYSIS DIPSTICK, ED / UC - Abnormal; Notable for the following components:      Result Value   Hgb urine dipstick TRACE (*)    Protein, ur 30 (*)    Leukocytes,Ua TRACE (*)    All other components within normal limits  POC URINE PREG, ED  CERVICOVAGINAL ANCILLARY ONLY    EKG   Radiology No results found.  Procedures Procedures (including critical care time)  Medications Ordered in UC Medications - No data to display  Initial Impression / Assessment and Plan / UC Course  I have reviewed the triage vital signs and the nursing notes.  Pertinent labs & imaging results that were available during my care of the patient were reviewed by me and considered in my medical decision making (see chart for details).     Vaginal discharge - will tx for yeast, send out aptima swab and call in any additional treatments pending workup.  STD screening - labs  deferred per pt request.  Final Clinical Impressions(s) / UC Diagnoses   Final diagnoses:  Vaginal discharge  Screen for STD (sexually transmitted disease)     Discharge Instructions      You were tested today for Gonorrhea, Chlamydia, Trichomonas, Bacterial Vaginosis and Yeast. We have called in a medication that will treat a yeast infection. Take your first tablet today. You may not need any additional treatment. You can take the second pill in 3 days if your symptoms persist. We will call with the results of your swab today. Please avoid all forms of intercourse until test results obtained.    ED Prescriptions     Medication Sig Dispense Auth. Provider   fluconazole (DIFLUCAN) 150 MG tablet Take one tab PO x1 dose now. May repeat in 3 days if needed. 2 tablet Hampton Cost L, PA      PDMP not reviewed this encounter.   Chaney Malling, [place] 04/02/21 1052

## 2021-04-02 NOTE — Discharge Instructions (Signed)
You were tested today for Gonorrhea, Chlamydia, Trichomonas, Bacterial Vaginosis and Yeast. We have called in a medication that will treat a yeast infection. Take your first tablet today. You may not need any additional treatment. You can take the second pill in 3 days if your symptoms persist. We will call with the results of your swab today. Please avoid all forms of intercourse until test results obtained.

## 2021-04-03 ENCOUNTER — Telehealth (HOSPITAL_COMMUNITY): Payer: Self-pay | Admitting: Emergency Medicine

## 2021-04-03 LAB — CERVICOVAGINAL ANCILLARY ONLY
Bacterial Vaginitis (gardnerella): POSITIVE — AB
Candida Glabrata: NEGATIVE
Candida Vaginitis: POSITIVE — AB
Chlamydia: NEGATIVE
Comment: NEGATIVE
Comment: NEGATIVE
Comment: NEGATIVE
Comment: NEGATIVE
Comment: NEGATIVE
Comment: NORMAL
Neisseria Gonorrhea: NEGATIVE
Trichomonas: NEGATIVE

## 2021-04-03 MED ORDER — METRONIDAZOLE 500 MG PO TABS: 500.0000 mg | ORAL_TABLET | Freq: Two times a day (BID) | ORAL | 0 refills | Status: DC

## 2021-05-26 IMAGING — US US MFM OB FOLLOW-UP
1 series · 14 of 28 positions shown · non-contrast
Comparison: none

[Series 1: us mfm ob follow-up · 74 acquisitions, 14 frames shown]
[im 3/74]
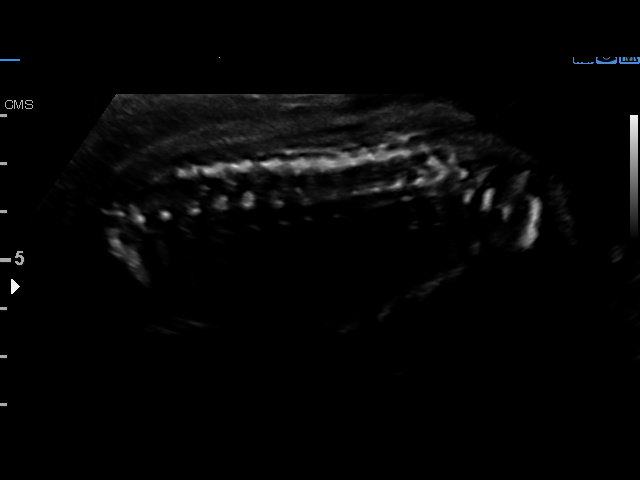
[im 9/74]
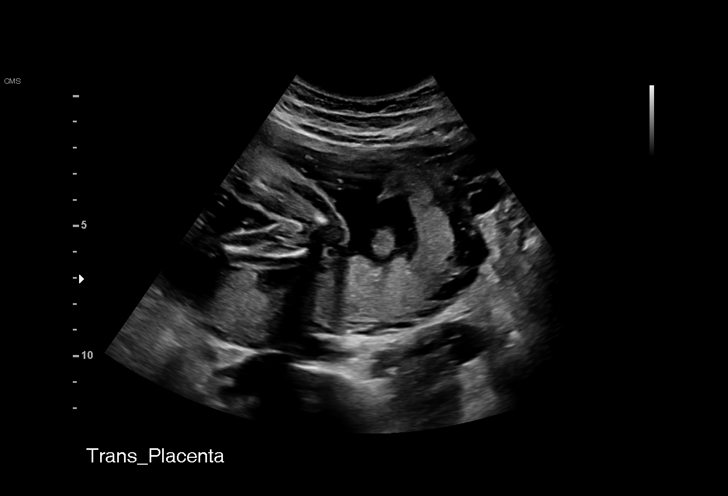
[im 14/74]
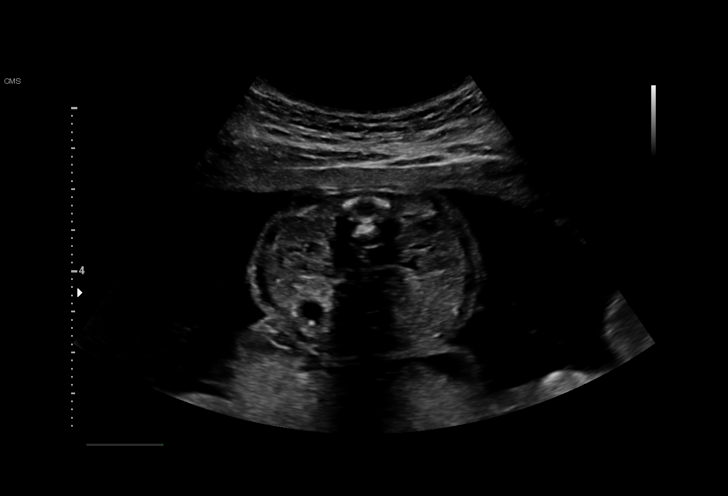
[im 19/74]
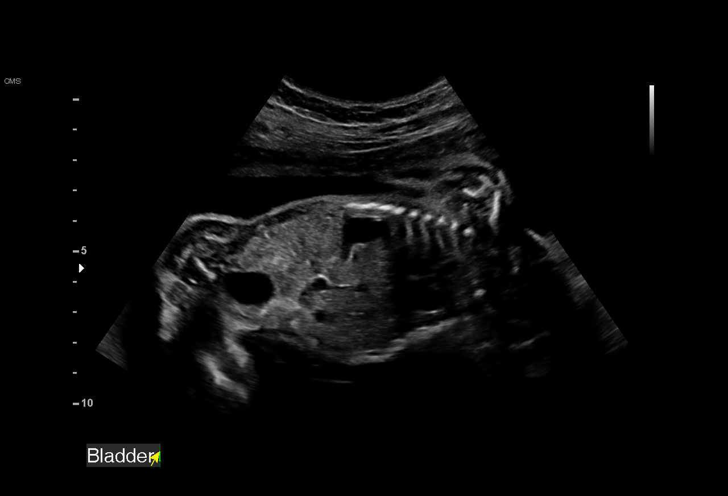
[im 25/74]
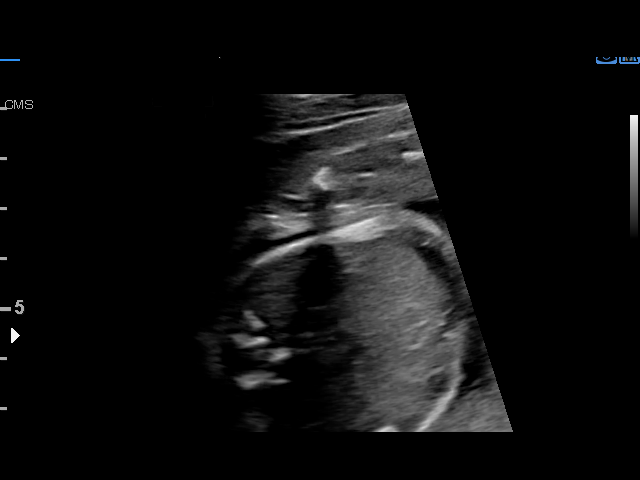
[im 30/74]
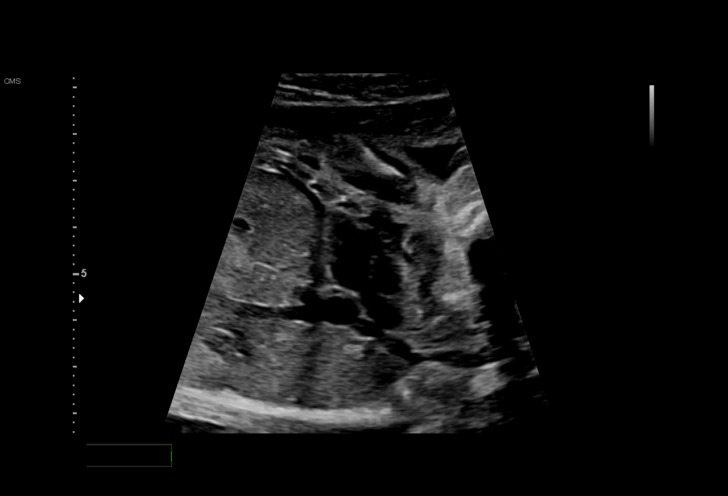
[im 36/74]
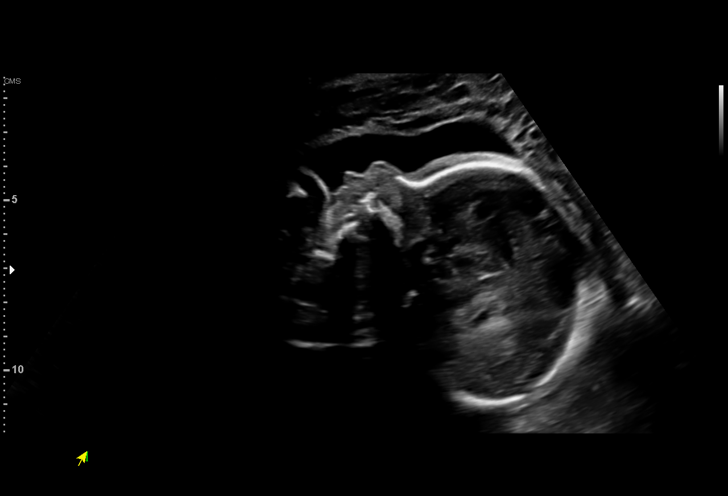
[im 41/74]
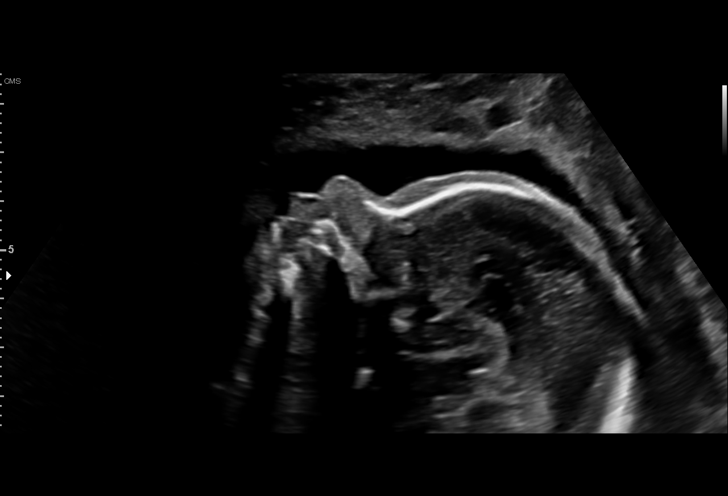
[im 46/74]
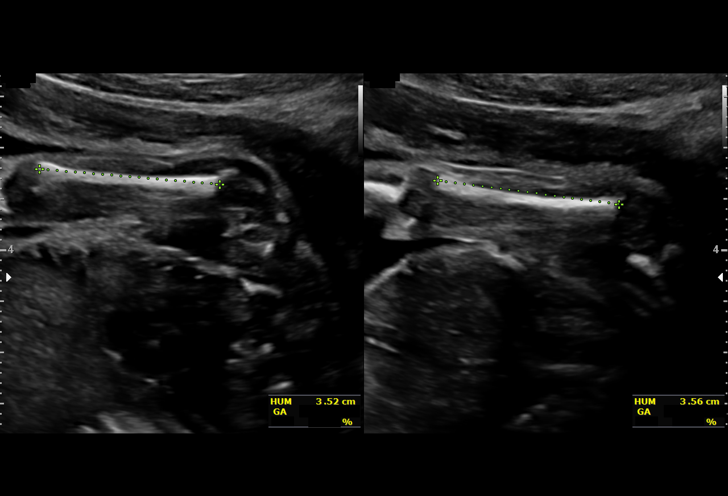
[im 52/74]
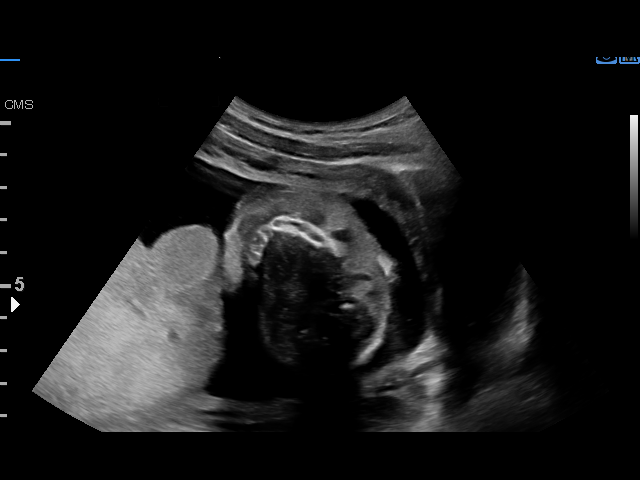
[im 57/74]
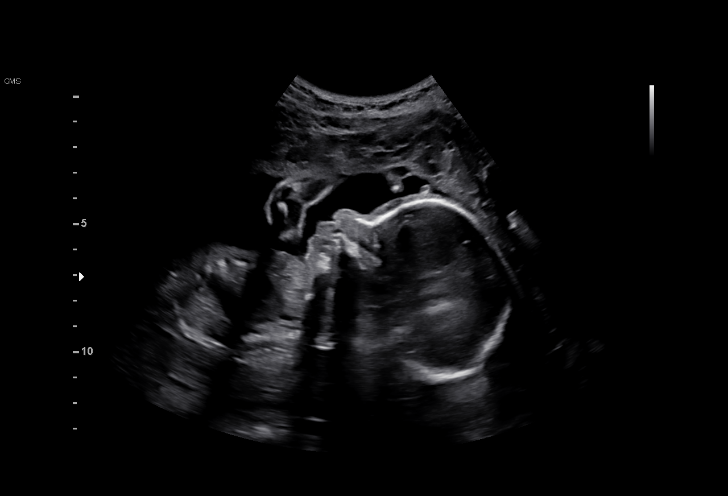
[im 63/74]
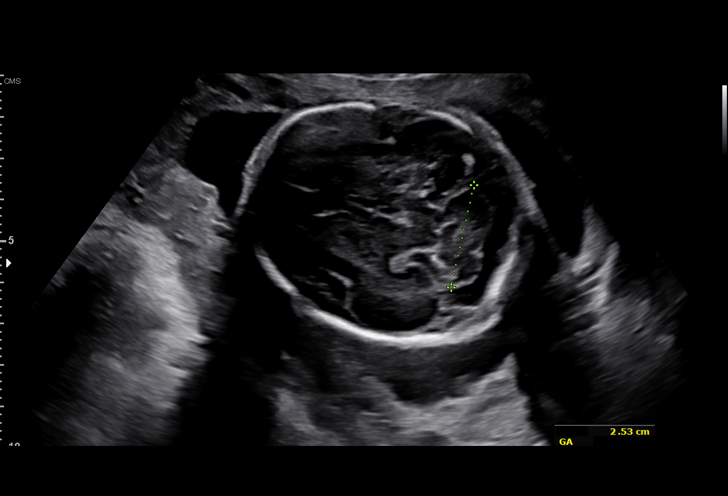
[im 68/74]
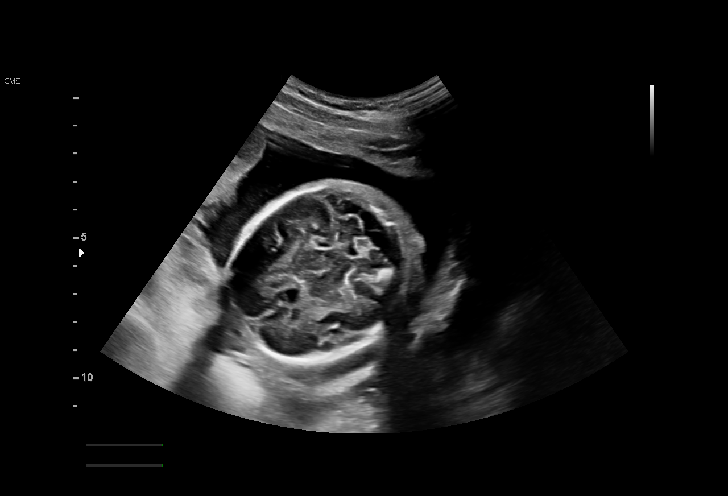
[im 74/74]
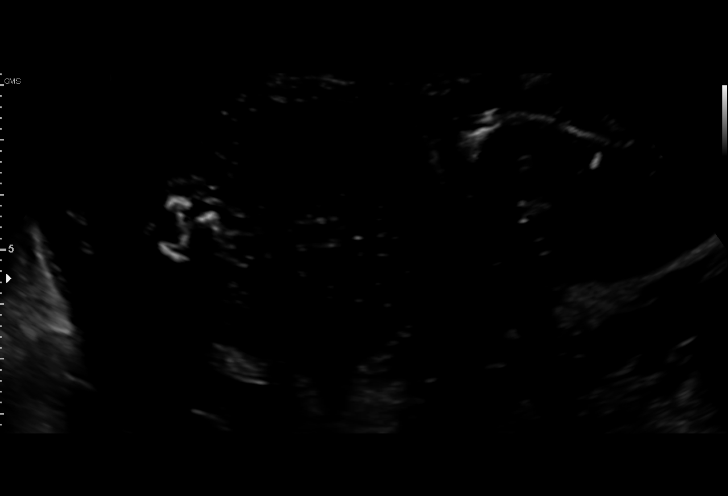

[14 of 28 positions shown; findings below may reference images not displayed]

STEINBERG CNM

Indications

 Antenatal follow-up for nonvisualized fetal
 anatomy (low risk NIPS, neg AFP)
 Fetal abnormality - other known or
 suspected (thickened nuchal fold)
 History of sickle cell trait
 Genetic carrier (silent carrier for Kishand Lung,6G3.O
 carrier for sickle cell disease, increased
 carrier risk for SMA)
 Anemia during pregnancy in second trimester
 23 weeks gestation of pregnancy
Vital Signs

                                                Height:        5'5"
Fetal Evaluation

 Num Of Fetuses:         1
 Fetal Heart Rate(bpm):  155
 Cardiac Activity:       Observed
 Presentation:           Cephalic
 Placenta:               Posterior
 P. Cord Insertion:      Visualized

 Amniotic Fluid
 AFI FV:      Within normal limits

                             Largest Pocket(cm)

Biometry

 BPD:      56.7  mm     G. Age:  23w 2d         58  %    CI:        70.33   %    70 - 86
                                                         FL/HC:      17.3   %    19.2 -
 HC:      215.6  mm     G. Age:  23w 4d         60  %    HC/AC:      1.23        1.05 -
 AC:      175.5  mm     G. Age:  22w 3d         25  %    FL/BPD:     66.0   %    71 - 87
 FL:       37.4  mm     G. Age:  21w 6d         11  %    FL/AC:      21.3   %    20 - 24
 HUM:      35.6  mm     G. Age:  22w 3d         28  %
 CER:      25.3  mm     G. Age:  23w 0d         80  %

 Est. FW:     501  gm      1 lb 2 oz     18  %
OB History

 Gravidity:    1
Gestational Age

 LMP:           23w 0d        Date:  03/09/19                 EDD:   12/14/19
 U/S Today:     22w 6d                                        EDD:   12/15/19
 Best:          23w 0d     Det. By:  LMP  (03/09/19)          EDD:   12/14/19
Anatomy

 Cranium:               Appears normal         Aortic Arch:            Appears normal
 Cavum:                 Previously seen        Ductal Arch:            Previously seen
 Ventricles:            Appears normal         Diaphragm:              Appears normal
 Choroid Plexus:        Appears normal         Stomach:                Appears normal, left
                                                                       sided
 Cerebellum:            Appears normal         Abdomen:                Appears normal
 Posterior Fossa:       Appears normal         Abdominal Wall:         Previously seen
 Nuchal Fold:           Prev vis as            Cord Vessels:           Previously seen
                        enlarged
 Face:                  Appears normal         Kidneys:                Appear normal
                        (orbits and profile)
 Lips:                  Appears normal         Bladder:                Appears normal
 Thoracic:              Appears normal         Spine:                  Previously seen
 Heart:                 Appears normal         Upper Extremities:      Previously seen
                        (4CH, axis, and
                        situs)
 RVOT:                  Appears normal         Lower Extremities:      Previously seen
 LVOT:                  Appears normal

 Other:  Heels and Rt 5th digit previously visualized. Technically difficult due
         to fetal position.
Impression

 Patient return for completion of fetal anatomy scan.  On
 previous ultrasound, increased nuchal fold thickness was
 seen.  On cell free fetal DNA screening, the risks of fetal
 aneuploidies are not increased.
 Amniotic fluid is normal and good fetal activity seen.Fetal
 growth is appropriate for gestational age .Fetal anatomical
 survey was completed and appears normal.

 We reassured the patient of the findings.
Recommendations

 -An appointment was made for her to return in 9 weeks for
 fetal growth assessment.
                 Montzinger, [place]

## 2021-07-28 IMAGING — US US MFM OB FOLLOW-UP
1 series · 14 of 28 positions shown · non-contrast
Comparison: none

[Series 1: us mfm ob follow-up · 36 acquisitions, 14 frames shown]
[im 2/36]
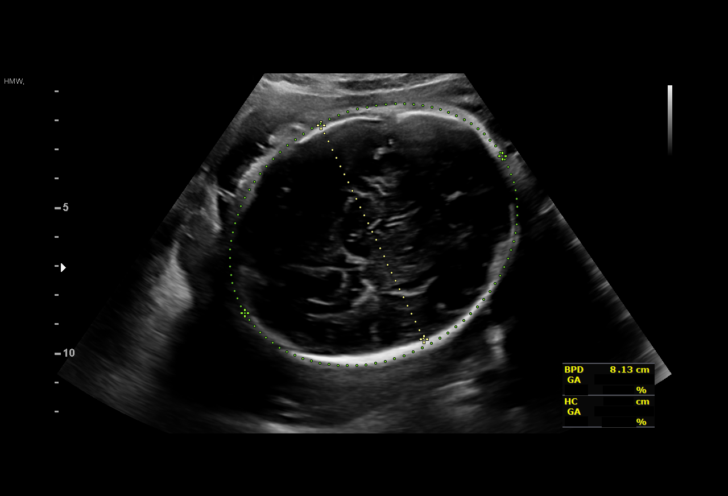
[im 4/36]
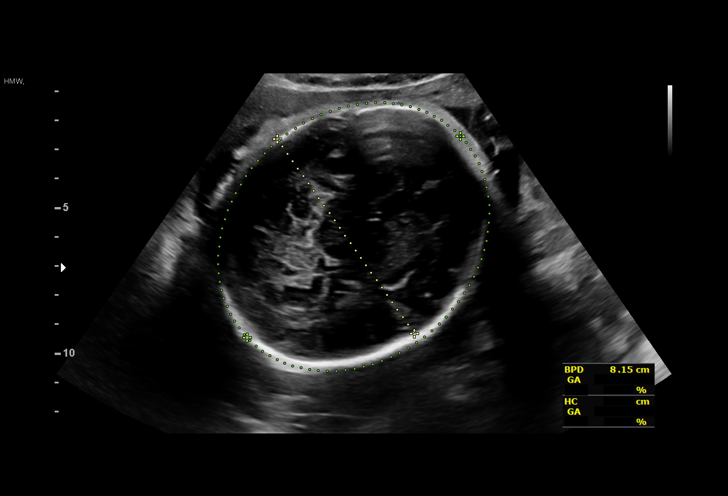
[im 7/36]
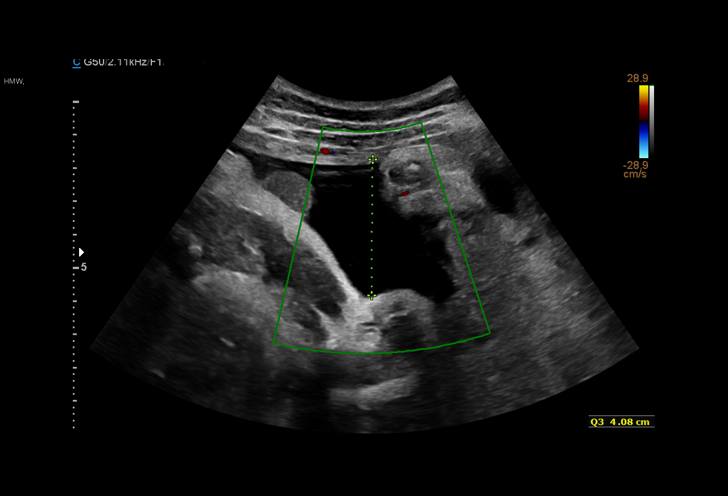
[im 10/36]
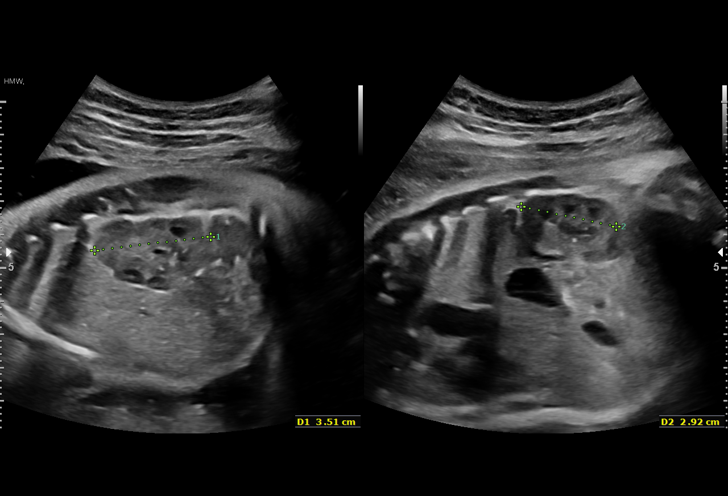
[im 12/36]
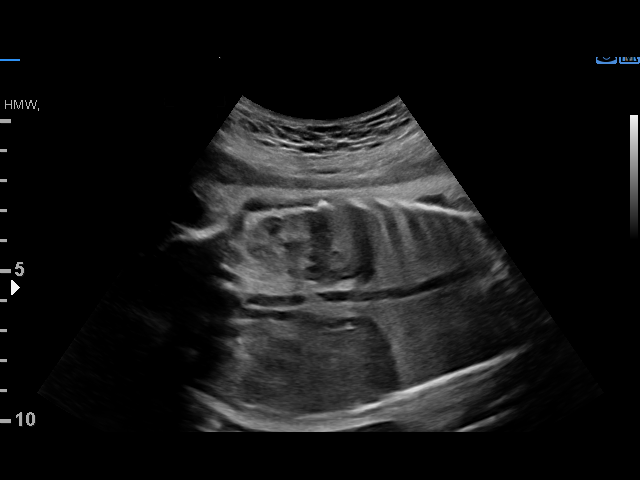
[im 15/36]
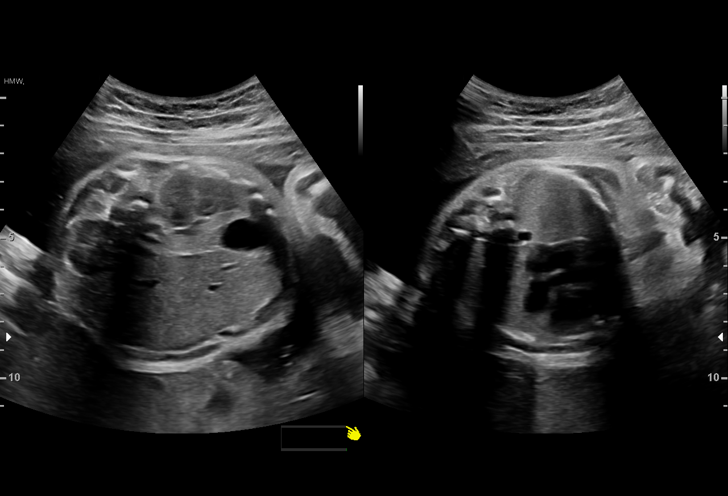
[im 17/36]
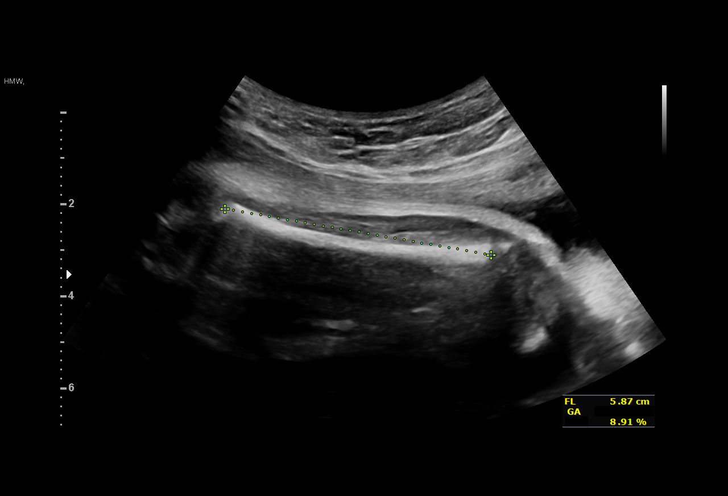
[im 20/36]
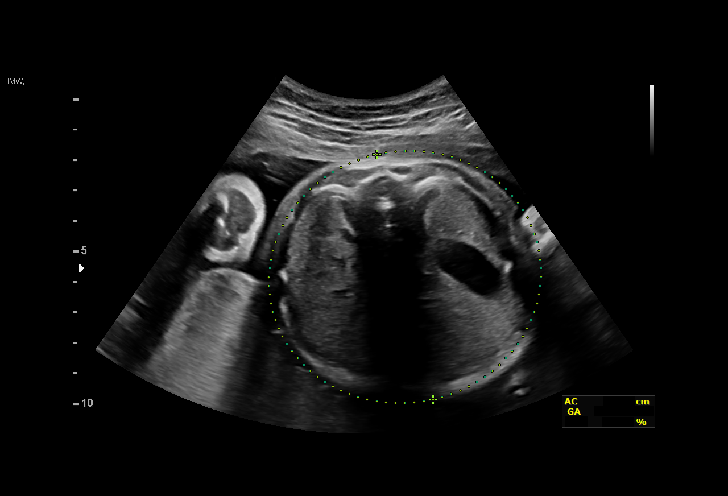
[im 23/36]
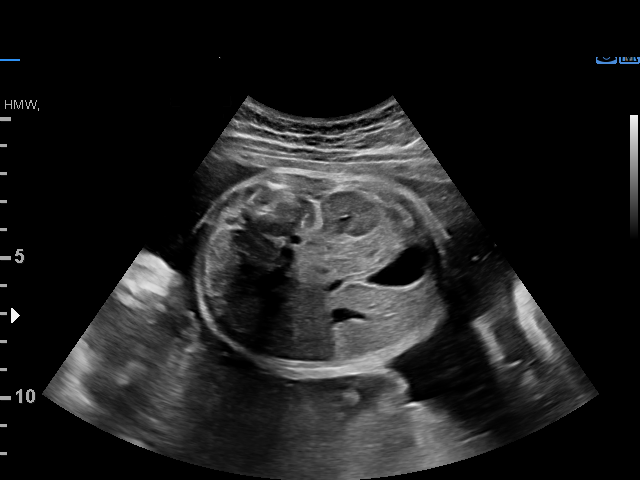
[im 25/36]
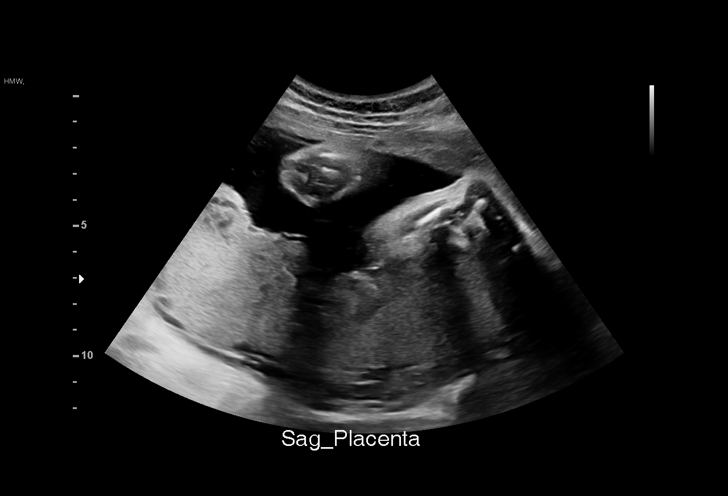
[im 28/36]
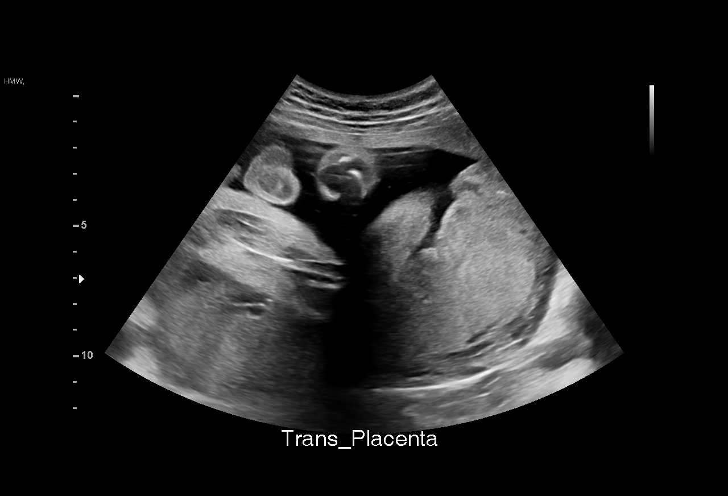
[im 30/36]
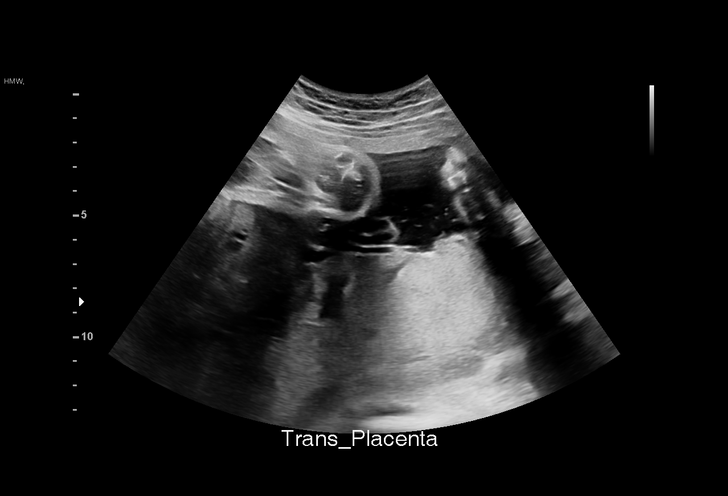
[im 33/36]
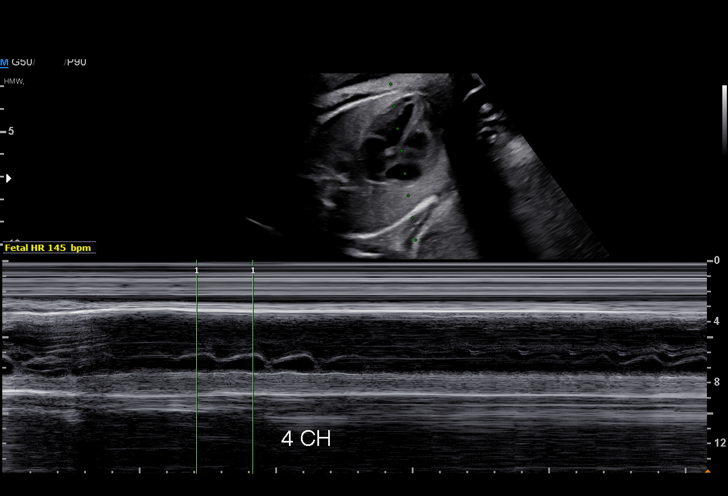
[im 36/36]
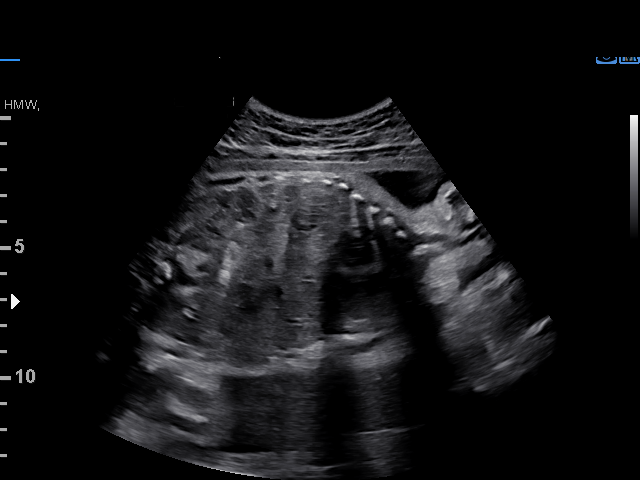

[14 of 28 positions shown; findings below may reference images not displayed]

SALAR CNM

Indications

 32 weeks gestation of pregnancy
 Antenatal follow-up for nonvisualized fetal
 anatomy (low risk NIPS, neg AFP)
 Fetal abnormality - other known or
 suspected (thickened nuchal fold)
 History of sickle cell trait
 Genetic carrier (silent carrier for Yessika Dowell,TOX.E
 carrier for sickle cell disease, increased
 carrier risk for SMA)
 Anemia during pregnancy in second trimester
 Encounter for other antenatal screening
 follow-up
Vital Signs

                                                Height:        5'5"
Fetal Evaluation

 Num Of Fetuses:         1
 Fetal Heart Rate(bpm):  145
 Cardiac Activity:       Observed
 Presentation:           Cephalic
 Placenta:               Posterior
 P. Cord Insertion:      Previously Visualized

 Amniotic Fluid
 AFI FV:      Within normal limits

 AFI Sum(cm)     %Tile       Largest Pocket(cm)
 11.58           28
 RUQ(cm)       RLQ(cm)       LUQ(cm)        LLQ(cm)

Biometry

 BPD:      81.4  mm     G. Age:  32w 5d         63  %    CI:         77.5   %    70 - 86
                                                         FL/HC:      19.8   %    19.1 -
 HC:      292.7  mm     G. Age:  32w 2d         21  %    HC/AC:      1.07        0.96 -
 AC:       273   mm     G. Age:  31w 3d         30  %    FL/BPD:     71.4   %    71 - 87
 FL:       58.1  mm     G. Age:  30w 3d          6  %    FL/AC:      21.3   %    20 - 24

 Est. FW:    3099  gm    3 lb 13 oz      19  %
OB History

 Gravidity:    1
Gestational Age

 LMP:           32w 0d        Date:  03/09/19                 EDD:   12/14/19
 U/S Today:     31w 5d                                        EDD:   12/16/19
 Best:          32w 0d     Det. By:  LMP  (03/09/19)          EDD:   12/14/19
Anatomy

 Cranium:               Appears normal         LVOT:                   Appears normal
 Cavum:                 Previously seen        Aortic Arch:            Previously seen
 Ventricles:            Previously seen        Ductal Arch:            Previously seen
 Choroid Plexus:        Previously seen        Diaphragm:              Appears normal
 Cerebellum:            Previously seen        Stomach:                Appears normal, left
                                                                       sided
 Posterior Fossa:       Previously seen        Abdomen:                Previously seen
 Nuchal Fold:           Prev vis as            Abdominal Wall:         Previously seen
                        enlarged
 Face:                  Orbits and profile     Cord Vessels:           Previously seen
                        previously seen
 Lips:                  Previously seen        Kidneys:                Appear normal
 Palate:                Not well visualized    Bladder:                Appears normal
 Thoracic:              Appears normal         Spine:                  Previously seen
 Heart:                 Appears normal         Upper Extremities:      Previously seen
                        (4CH, axis, and
                        situs)
 RVOT:                  Previously seen        Lower Extremities:      Previously seen

 Other:  Heels and Rt 5th digit previously visualized. Technically difficult due
         to fetal position.
Cervix Uterus Adnexa

 Cervix
 Not visualized (advanced GA >71wks)

 Uterus
 No abnormality visualized.

 Right Ovary
 No adnexal mass visualized.
 Left Ovary
 No adnexal mass visualized.

 Cul De Sac
 No free fluid seen.

 Adnexa
 No abnormality visualized.
Impression

 Follow up growth due to prior thickend nuchal fold with a
 normal NIPS
 Normal interval growth with measurments consistent with
 dates.
 There is normal amniotic fluid and fetal movement.
Recommendations

 Follow up growth as clinically indicated.

## 2021-08-14 ENCOUNTER — Encounter (HOSPITAL_COMMUNITY): Payer: Self-pay | Admitting: Emergency Medicine

## 2021-08-14 ENCOUNTER — Ambulatory Visit (HOSPITAL_COMMUNITY)
Admission: EM | Admit: 2021-08-14 | Discharge: 2021-08-14 | Disposition: A | Discharge status: Home / Self Care | Payer: Medicaid Other

## 2021-08-14 DIAGNOSIS — R231 Pallor: Secondary | ICD-10-CM

## 2021-08-14 NOTE — ED Provider Notes (Signed)
[facility] Provider Note  Patient Contact: 5:38 PM (approximate)   History   Rash   HPI  Stephanie Sloan is a 21 y.o. female with a history of anemia, presents to the emergency department with a lacy, purple, reticulated rash of the bilateral lower calves.  Patient reports that she does not have a rash anywhere else on her body.  She denies fever, chills or pain.  She states that she noticed symptoms developing in May.  No chest pain, chest tightness or abdominal pain.  No shortness of breath.      Physical Exam   Triage Vital Signs: ED Triage Vitals  Enc Vitals Group     BP 08/14/21 1719 120/82     Pulse Rate 08/14/21 1719 91     Resp 08/14/21 1719 18     Temp 08/14/21 1719 98.4 F (36.9 C)     Temp src --      SpO2 08/14/21 1719 99 %     Weight --      Height --      Head Circumference --      Peak Flow --      Pain Score 08/14/21 1718 0     Pain Loc --      Pain Edu? --      Excl. in [place]? --     Most recent vital signs: Vitals:   08/14/21 1719  BP: 120/82  Pulse: 91  Resp: 18  Temp: 98.4 F (36.9 C)  SpO2: 99%     General: Alert and in no acute distress. Eyes:  PERRL. EOMI. Head: No acute traumatic findings ENT:      Ears:       Nose: No congestion/rhinnorhea.      Mouth/Throat: Mucous membranes are moist. Neck: No stridor. No cervical spine tenderness to palpation. Cardiovascular:  Good peripheral perfusion Respiratory: Normal respiratory effort without tachypnea or retractions. Lungs CTAB. Good air entry to the bases with no decreased or absent breath sounds. Gastrointestinal: Bowel sounds 4 quadrants. Soft and nontender to palpation. No guarding or rigidity. No palpable masses. No distention. No CVA tenderness. Musculoskeletal: Full range of motion to all extremities.  Neurologic:  No gross focal neurologic deficits are appreciated.  Skin: Patient has a lacy, reticulated rash of bilateral lower calves.    ED Results  / Procedures / Treatments   Labs (all labs ordered are listed, but only abnormal results are displayed) Labs Reviewed - No data to display     PROCEDURES:  Critical Care performed: No  Procedures   MEDICATIONS ORDERED IN ED: Medications - No data to display   IMPRESSION / MDM / ASSESSMENT AND PLAN / ED COURSE  I reviewed the triage vital signs and the nursing notes.                              Assessment and plan Livedo reticularis 21 year old female presents to the urgent care with a lacy, reticulated rash along the posterior calves consistent with a diagnosis of livedo reticularis.  Vital signs were reassuring at triage.  On exam, patient was alert, active and nontoxic-appearing.  I did help patient identify a potential primary care provider who can help her with further testing for underlying cause of livedo reticularis.  Patient voiced understanding and feels comfortable with establishing care with primary care.     FINAL CLINICAL IMPRESSION(S) / ED DIAGNOSES   Final diagnoses:  Livedo reticularis     Rx / DC Orders   ED Discharge Orders     None        Note:  This document was prepared using Dragon voice recognition software and may include unintentional dictation errors.   Pia Mau [place], [place] 08/14/21 1742

## 2021-08-14 NOTE — Discharge Instructions (Signed)
The next available appointment is with Dr. Doreene Burke on August 18, 2021 Please schedule an appointment so that more testing can be done for an underlying cause of this livedo reticularis

## 2021-08-27 ENCOUNTER — Ambulatory Visit: Payer: Medicaid Other | Admitting: Family Medicine

## 2021-09-12 ENCOUNTER — Telehealth (HOSPITAL_COMMUNITY): Payer: Self-pay | Admitting: *Deleted

## 2021-09-12 ENCOUNTER — Encounter (HOSPITAL_COMMUNITY): Payer: Self-pay

## 2021-09-12 ENCOUNTER — Ambulatory Visit (HOSPITAL_COMMUNITY)
Admission: RE | Admit: 2021-09-12 | Discharge: 2021-09-12 | Disposition: A | Discharge status: Home / Self Care | Payer: Medicaid Other | Source: Ambulatory Visit | Attending: Physician Assistant | Admitting: Physician Assistant

## 2021-09-12 VITALS — BP 117/74 | HR 112 | Temp 98.0°F | Resp 16 | Ht 65.0 in | Wt 119.0 lb

## 2021-09-12 DIAGNOSIS — R350 Frequency of micturition: Secondary | ICD-10-CM | POA: Insufficient documentation

## 2021-09-12 DIAGNOSIS — R300 Dysuria: Secondary | ICD-10-CM | POA: Diagnosis not present

## 2021-09-12 DIAGNOSIS — N760 Acute vaginitis: Secondary | ICD-10-CM

## 2021-09-12 DIAGNOSIS — R310 Gross hematuria: Secondary | ICD-10-CM | POA: Insufficient documentation

## 2021-09-12 LAB — POC URINE PREG, ED: Preg Test, Ur: NEGATIVE

## 2021-09-12 LAB — POCT URINALYSIS DIPSTICK, ED / UC
Bilirubin Urine: NEGATIVE
Glucose, UA: NEGATIVE mg/dL
Ketones, ur: NEGATIVE mg/dL
Nitrite: NEGATIVE
Protein, ur: NEGATIVE mg/dL
Specific Gravity, Urine: 1.015 (ref 1.005–1.030)
Urobilinogen, UA: 1 mg/dL (ref 0.0–1.0)
pH: 6.5 (ref 5.0–8.0)

## 2021-09-12 MED ORDER — NITROFURANTOIN MONOHYD MACRO 100 MG PO CAPS: 100.0000 mg | ORAL_CAPSULE | Freq: Two times a day (BID) | ORAL | 0 refills | Status: DC

## 2021-09-12 NOTE — ED Triage Notes (Addendum)
Patient states she is having LLQ abdominal pain/cramping, blood in her urine and bowel movements. Patient also having urinary frequency. Notice light red blood when wiping after using the bathroom. Patient also having slight vaginal discharge with a white coloration.  Onset about 5 days ago.

## 2021-09-12 NOTE — Discharge Instructions (Addendum)
Advised to increase fluid intake clear liquids along with cranberry juice to help flush the urinary system. Advised to take the Macrobid capsules 1 every 12 hours until completed to treat the urinary tract infection. The STI screening will take 48 hours to be completed, if you do not hear from our office then that means that the testing is negative.

## 2021-09-12 NOTE — ED Provider Notes (Signed)
[facility]    CSN: [id number] Arrival date & time: 09/12/21  1214      History   Chief Complaint Chief Complaint  Patient presents with   Vaginal Bleeding    I've  been having cramps on my left side , and when I urine and I wipe its blood & blood in my stool like light red blood . And I keep having to pee back to back . I'm not sure if it's a uti of bladder infection . This just started like 5/6 days ago - Entered by patient   Vaginal Discharge    HPI Viola Kinnick is a 21 y.o. female.   21 year old female presents with abdominal pain, vaginal discharge, frequency.  Patient indicates for the past 5 days she has been having increased urinary frequency, urgency and hematuria.  Patient relates when she wipes after urinating the tissue has blood on it.  Patient relates that she also has abdominal pain which is confined to the left lower quadrant, intermittent, of cramping type, usually occurring only with urination.  Patient indicates she also has noticed that after she has a bowel movement and wipes there is a small amount of spotting blood on the tissue.  Patient indicates she is not having any constipation and does not have to strain to have bowel movement.  Patient also indicates that she has had several days worth of vaginal discharge, thin and white, with a faint odor.  Patient relates she does not have any vaginal itching.  Patient indicates that her last menses was at the end of last month around June 23 24th.  Patient relates that her menses was normal and that she is due to start she relates any day now.  Patient relates her last sexual contact was March of this year.  Patient relates she does not have any fever, chills, no nausea or vomiting.  Patient relates that she has a normal appetite.  Patient relates her stools have not been black but have been normal color.   Vaginal Bleeding Associated symptoms: abdominal pain (LLQ with urination) and vaginal discharge (white,  thin with odor)   Vaginal Discharge Associated symptoms: abdominal pain (LLQ with urination)     Past Medical History:  Diagnosis Date   Medical history non-contributory     Patient Active Problem List   Diagnosis Date Noted   Gestational hypertension 12/13/2019   Vaginal delivery 12/13/2019   Third degree perineal laceration 12/13/2019   Post term pregnancy at [redacted] weeks gestation 12/12/2019   Genetic carrier 09/12/2019   Sickle cell trait in mother affecting pregnancy ([place]) 06/09/2019   Anemia affecting pregnancy in third trimester 06/01/2019   Supervision of normal pregnancy, antepartum 05/24/2019    Past Surgical History:  Procedure Laterality Date   NO PAST SURGERIES      OB History     Gravida  1   Para  1   Term  1   Preterm  0   AB  0   Living  1      SAB  0   IAB  0   Ectopic  0   Multiple      Live Births  1            Home Medications    Prior to Admission medications   Medication Sig Start Date End Date Taking? Authorizing Provider  nitrofurantoin, macrocrystal-monohydrate, (MACROBID) 100 MG capsule Take 1 capsule (100 mg total) by mouth 2 (two) times daily. 09/12/21  Yes Nyoka Lint, PA-C  acetaminophen (TYLENOL) 325 MG tablet Take 2 tablets (650 mg total) by mouth every 4 (four) hours as needed (for pain scale < 4). Patient not taking: Reported on 12/05/2020 26/37/85   Arrie Senate, MD  amLODipine (NORVASC) 5 MG tablet TAKE 1 TABLET (5 MG TOTAL) BY MOUTH DAILY. Patient not taking: No sig reported 12/14/19 88/50/27  Arrie Senate, MD  Blood Pressure Monitoring (BLOOD PRESSURE KIT) DEVI 1 Device by Does not apply route as needed. 08/26/10   Arrie Senate, MD  ferrous sulfate 325 (65 FE) MG tablet Take 1 tablet (325 mg total) by mouth daily. 12/05/20   Alroy Bailiff, Margaux, PA-C  fluconazole (DIFLUCAN) 150 MG tablet Take one tab PO x1 dose now. May repeat in 3 days if needed. 04/02/21   Crain, Whitney L, PA  ibuprofen (ADVIL)  600 MG tablet Take 1 tablet (600 mg total) by mouth every 6 (six) hours. Patient not taking: Reported on 12/05/2020 87/86/76   Arrie Senate, MD  metroNIDAZOLE (FLAGYL) 500 MG tablet Take 1 tablet (500 mg total) by mouth 2 (two) times daily. 04/03/21   Lamptey, Myrene Galas, MD  norethindrone (MICRONOR) 0.35 MG tablet TAKE 1 TABLET (0.35 MG TOTAL) BY MOUTH DAILY. Patient not taking: Reported on 10/16/2020 12/14/19 72/09/47  Arrie Senate, MD  Prenatal Vit-Fe Fumarate-FA (PREPLUS) 27-1 MG TABS Take 1 tablet by mouth daily. Patient not taking: Reported on 10/16/2020 05/08/19   Chancy Milroy, MD    Family History Family History  Problem Relation Age of Onset   Healthy Mother    Healthy Father     Social History Social History   Tobacco Use   Smoking status: Passive Smoke Exposure - Never Smoker   Smokeless tobacco: Never  Vaping Use   Vaping Use: Never used  Substance Use Topics   Alcohol use: Never   Drug use: Never     Allergies   Patient has no known allergies.   Review of Systems Review of Systems  Gastrointestinal:  Positive for abdominal pain (LLQ with urination).  Genitourinary:  Positive for vaginal discharge (white, thin with odor).     Physical Exam Triage Vital Signs ED Triage Vitals  Enc Vitals Group     BP 09/12/21 1227 117/74     Pulse Rate 09/12/21 1227 (!) 112     Resp 09/12/21 1227 16     Temp 09/12/21 1227 98 F (36.7 C)     Temp Source 09/12/21 1227 Oral     SpO2 09/12/21 1227 98 %     Weight 09/12/21 1228 119 lb (54 kg)     Height 09/12/21 1228 '5\' 5"'  (1.651 m)     Head Circumference --      Peak Flow --      Pain Score 09/12/21 1228 10     Pain Loc --      Pain Edu? --      Excl. in [place]? --    No data found.  Updated Vital Signs BP 117/74 (BP Location: Left Arm)   Pulse (!) 112   Temp 98 F (36.7 C) (Oral)   Resp 16   Ht '5\' 5"'  (1.651 m)   Wt 119 lb (54 kg)   LMP 08/21/2021 (Within Weeks)   SpO2 98%   Breastfeeding No    BMI 19.80 kg/m   Visual Acuity Right Eye Distance:   Left Eye Distance:   Bilateral Distance:    Right Eye Near:  Left Eye Near:    Bilateral Near:     Physical Exam Constitutional:      Appearance: Normal appearance.  Cardiovascular:     Rate and Rhythm: Normal rate and regular rhythm.     Heart sounds: Normal heart sounds.  Pulmonary:     Effort: Pulmonary effort is normal.     Breath sounds: Normal breath sounds and air entry. No wheezing, rhonchi or rales.  Abdominal:     General: Abdomen is flat. Bowel sounds are normal.     Palpations: Abdomen is soft.     Tenderness: There is no abdominal tenderness. There is no guarding or rebound.  Neurological:     Mental Status: She is alert.      UC Treatments / Results  Labs (all labs ordered are listed, but only abnormal results are displayed) Labs Reviewed  POCT URINALYSIS DIPSTICK, ED / UC - Abnormal; Notable for the following components:      Result Value   Hgb urine dipstick TRACE (*)    Leukocytes,Ua SMALL (*)    All other components within normal limits  URINE CULTURE  POC URINE PREG, ED  CERVICOVAGINAL ANCILLARY ONLY    EKG   Radiology No results found.  Procedures Procedures (including critical care time)  Medications Ordered in UC Medications - No data to display  Initial Impression / Assessment and Plan / UC Course  I have reviewed the triage vital signs and the nursing notes.  Pertinent labs & imaging results that were available during my care of the patient were reviewed by me and considered in my medical decision making (see chart for details).    Plan 1.  Advised take the Macrobid capsules 1 every 12 hours until completed to treat urinary tract infection. 2.  STI testing is pending screening for GC/chlamydia/trichomonas/BV/yeast infection 3.  Advised patient to follow-up with PCP or to return if symptoms fail to improve. 4.  Advised to take ibuprofen or Tylenol for to help relieve  cramping pain. Final Clinical Impressions(s) / UC Diagnoses   Final diagnoses:  Frequency of urination  Dysuria  Gross hematuria  Vaginitis and vulvovaginitis     Discharge Instructions      Advised to increase fluid intake clear liquids along with cranberry juice to help flush the urinary system. Advised to take the Macrobid capsules 1 every 12 hours until completed to treat the urinary tract infection. The STI screening will take 48 hours to be completed, if you do not hear from our office then that means that the testing is negative.    ED Prescriptions     Medication Sig Dispense Auth. Provider   nitrofurantoin, macrocrystal-monohydrate, (MACROBID) 100 MG capsule Take 1 capsule (100 mg total) by mouth 2 (two) times daily. 10 capsule Nyoka Lint, PA-C      PDMP not reviewed this encounter.   Nyoka Lint, PA-C 09/12/21 1300

## 2021-09-13 LAB — URINE CULTURE: Culture: <10000 — AB

## 2021-09-14 ENCOUNTER — Telehealth (HOSPITAL_COMMUNITY): Payer: Self-pay | Admitting: Emergency Medicine

## 2021-09-14 LAB — CERVICOVAGINAL ANCILLARY ONLY
Bacterial Vaginitis (gardnerella): POSITIVE — AB
Candida Glabrata: NEGATIVE
Candida Vaginitis: NEGATIVE
Chlamydia: NEGATIVE
Comment: NEGATIVE
Comment: NEGATIVE
Comment: NEGATIVE
Comment: NEGATIVE
Comment: NEGATIVE
Comment: NORMAL
Neisseria Gonorrhea: NEGATIVE
Trichomonas: NEGATIVE

## 2021-09-14 MED ORDER — METRONIDAZOLE 500 MG PO TABS: 500.0000 mg | ORAL_TABLET | Freq: Two times a day (BID) | ORAL | 0 refills | Status: DC

## 2021-09-15 ENCOUNTER — Telehealth (HOSPITAL_COMMUNITY): Payer: Self-pay | Admitting: Emergency Medicine

## 2021-09-15 MED ORDER — METRONIDAZOLE 500 MG PO TABS: 500.0000 mg | ORAL_TABLET | Freq: Two times a day (BID) | ORAL | 0 refills | Status: DC

## 2021-09-15 NOTE — Telephone Encounter (Signed)
Pt called requesting medication be sent to [facility]. RX resent

## 2022-04-09 ENCOUNTER — Ambulatory Visit (HOSPITAL_COMMUNITY)
Admission: EM | Admit: 2022-04-09 | Discharge: 2022-04-09 | Disposition: A | Discharge status: Home / Self Care | Payer: Medicaid Other | Attending: Internal Medicine | Admitting: Internal Medicine

## 2022-04-09 ENCOUNTER — Encounter (HOSPITAL_COMMUNITY): Payer: Self-pay

## 2022-04-09 ENCOUNTER — Ambulatory Visit (HOSPITAL_COMMUNITY): Payer: Self-pay

## 2022-04-09 DIAGNOSIS — J029 Acute pharyngitis, unspecified: Secondary | ICD-10-CM | POA: Diagnosis not present

## 2022-04-09 LAB — POCT RAPID STREP A, ED / UC: Streptococcus, Group A Screen (Direct): NEGATIVE

## 2022-04-09 NOTE — Discharge Instructions (Addendum)
You may take Advil or Tylenol as needed for sore throat. Use Cepestat throat spray as needed for throat pain If you get worse and a fever come back.

## 2022-04-09 NOTE — ED Triage Notes (Signed)
Patient c/o a sore throat x 5 days.  Patient states she has been drinking tea, but has not taken any medications for her sore throat.

## 2022-04-09 NOTE — ED Provider Notes (Signed)
[facility]    CSN: [id number] Arrival date & time: 04/09/22  1255      History   Chief Complaint Chief Complaint  Patient presents with   Sore Throat    HPI Stephanie Sloan is a 22 y.o. female who presents with ST x 5 days. Has not been taking any meds for symptoms. She denies the ST being too bad today. Has not had URI or fever. Her energy and appetite have been normal. She will need a note for work today since she missed.     Past Medical History:  Diagnosis Date   Medical history non-contributory     Patient Active Problem List   Diagnosis Date Noted   Gestational hypertension 12/13/2019   Vaginal delivery 12/13/2019   Third degree perineal laceration 12/13/2019   Post term pregnancy at [redacted] weeks gestation 12/12/2019   Genetic carrier 09/12/2019   Sickle cell trait in mother affecting pregnancy ([place]) 06/09/2019   Anemia affecting pregnancy in third trimester 06/01/2019   Supervision of normal pregnancy, antepartum 05/24/2019    Past Surgical History:  Procedure Laterality Date   NO PAST SURGERIES      OB History     Gravida  1   Para  1   Term  1   Preterm  0   AB  0   Living  1      SAB  0   IAB  0   Ectopic  0   Multiple      Live Births  1            Home Medications    Prior to Admission medications   Medication Sig Start Date End Date Taking? Authorizing Provider  Blood Pressure Monitoring (BLOOD PRESSURE KIT) DEVI 1 Device by Does not apply route as needed. [id number]   Arrie Senate, MD  ferrous sulfate 325 (65 FE) MG tablet Take 1 tablet (325 mg total) by mouth daily. 12/05/20   Eustaquio Maize, PA-C    Family History Family History  Problem Relation Age of Onset   Healthy Mother    Healthy Father     Social History Social History   Tobacco Use   Smoking status: Some Days    Types: Cigarettes    Passive exposure: Yes   Smokeless tobacco: Never  Vaping Use   Vaping Use: Never used  Substance Use  Topics   Alcohol use: Never   Drug use: Never     Allergies   Patient has no known allergies.   Review of Systems Review of Systems  HENT:  Positive for sore throat.    The rest is negative  Physical Exam Triage Vital Signs ED Triage Vitals  Enc Vitals Group     BP 04/09/22 1353 119/75     Pulse Rate 04/09/22 1353 95     Resp 04/09/22 1353 14     Temp 04/09/22 1353 98.3 F (36.8 C)     Temp Source 04/09/22 1353 Oral     SpO2 04/09/22 1353 99 %     Weight --      Height --      Head Circumference --      Peak Flow --      Pain Score 04/09/22 1355 4     Pain Loc --      Pain Edu? --      Excl. in [place]? --    No data found.  Updated Vital Signs BP 119/75 (BP  Location: Right Arm)   Pulse 95   Temp 98.3 F (36.8 C) (Oral)   Resp 14   LMP 04/09/2022   SpO2 99%   Visual Acuity Right Eye Distance:   Left Eye Distance:   Bilateral Distance:    Right Eye Near:   Left Eye Near:    Bilateral Near:       Physical Exam Vitals signs and nursing note reviewed.  Constitutional:      General: She is not in acute distress.    Appearance: Normal appearance. She is not ill-appearing, toxic-appearing or diaphoretic.  HENT:     Head: Normocephalic.     Right Ear: Tympanic membrane, ear canal and external ear normal.     Left Ear: Tympanic membrane, ear canal and external ear normal.     Nose: Nose normal.     Mouth/Throat: clear    Mouth: Mucous membranes are moist.  Eyes:     General: No scleral icterus.       Right eye: No discharge.        Left eye: No discharge.     Conjunctiva/sclera: Conjunctivae normal.  Neck:     Musculoskeletal: Neck supple. No neck rigidity.  Cardiovascular:     Rate and Rhythm: Normal rate and regular rhythm.     Heart sounds: No murmur.  Pulmonary:     Effort: Pulmonary effort is normal.     Breath sounds: Normal breath sounds.  Musculoskeletal: Normal range of motion.  Lymphadenopathy:     Cervical: has small upper L chain  shotty node, mobile and not tender Skin:    General: Skin is warm and dry.     Coloration: Skin is not jaundiced.     Findings: No rash.  Neurological:     Mental Status: She is alert and oriented to person, place, and time.     Gait: Gait normal.  Psychiatric:        Mood and Affect: Mood normal.        Behavior: Behavior normal.        Thought Content: Thought content normal.        Judgment: Judgment normal.   UC Treatments / Results  Labs (all labs ordered are listed, but only abnormal results are displayed) Labs Reviewed  POCT RAPID STREP A, ED / UC    EKG   Radiology No results found.  Procedures Procedures (including critical care time)  Medications Ordered in UC Medications - No data to display  Initial Impression / Assessment and Plan / UC Course  I have reviewed the triage vital signs and the nursing notes.  Pertinent labs  results that were available during my care of the patient were reviewed by me and considered in my medical decision making (see chart for details).  Viral pharyngitis  Throat culture sent out and we will inform her if positive.  See instructions   Final Clinical Impressions(s) / UC Diagnoses   Final diagnoses:  Acute viral pharyngitis     Discharge Instructions      You may take Advil or Tylenol as needed for sore throat.      ED Prescriptions   None    PDMP not reviewed this encounter.   Shelby Mattocks, PA-C 04/09/22 1503

## 2022-04-12 LAB — CULTURE, GROUP A STREP (THRC)

## 2022-07-07 ENCOUNTER — Ambulatory Visit (HOSPITAL_COMMUNITY): Payer: Self-pay

## 2022-09-14 IMAGING — CR DG CHEST 2V
2 series · 2 of 2 positions shown · non-contrast
Comparison: None.

CLINICAL DATA: Chest pain, dyspnea

EXAM:
CHEST - 2 VIEW

[chest pa]
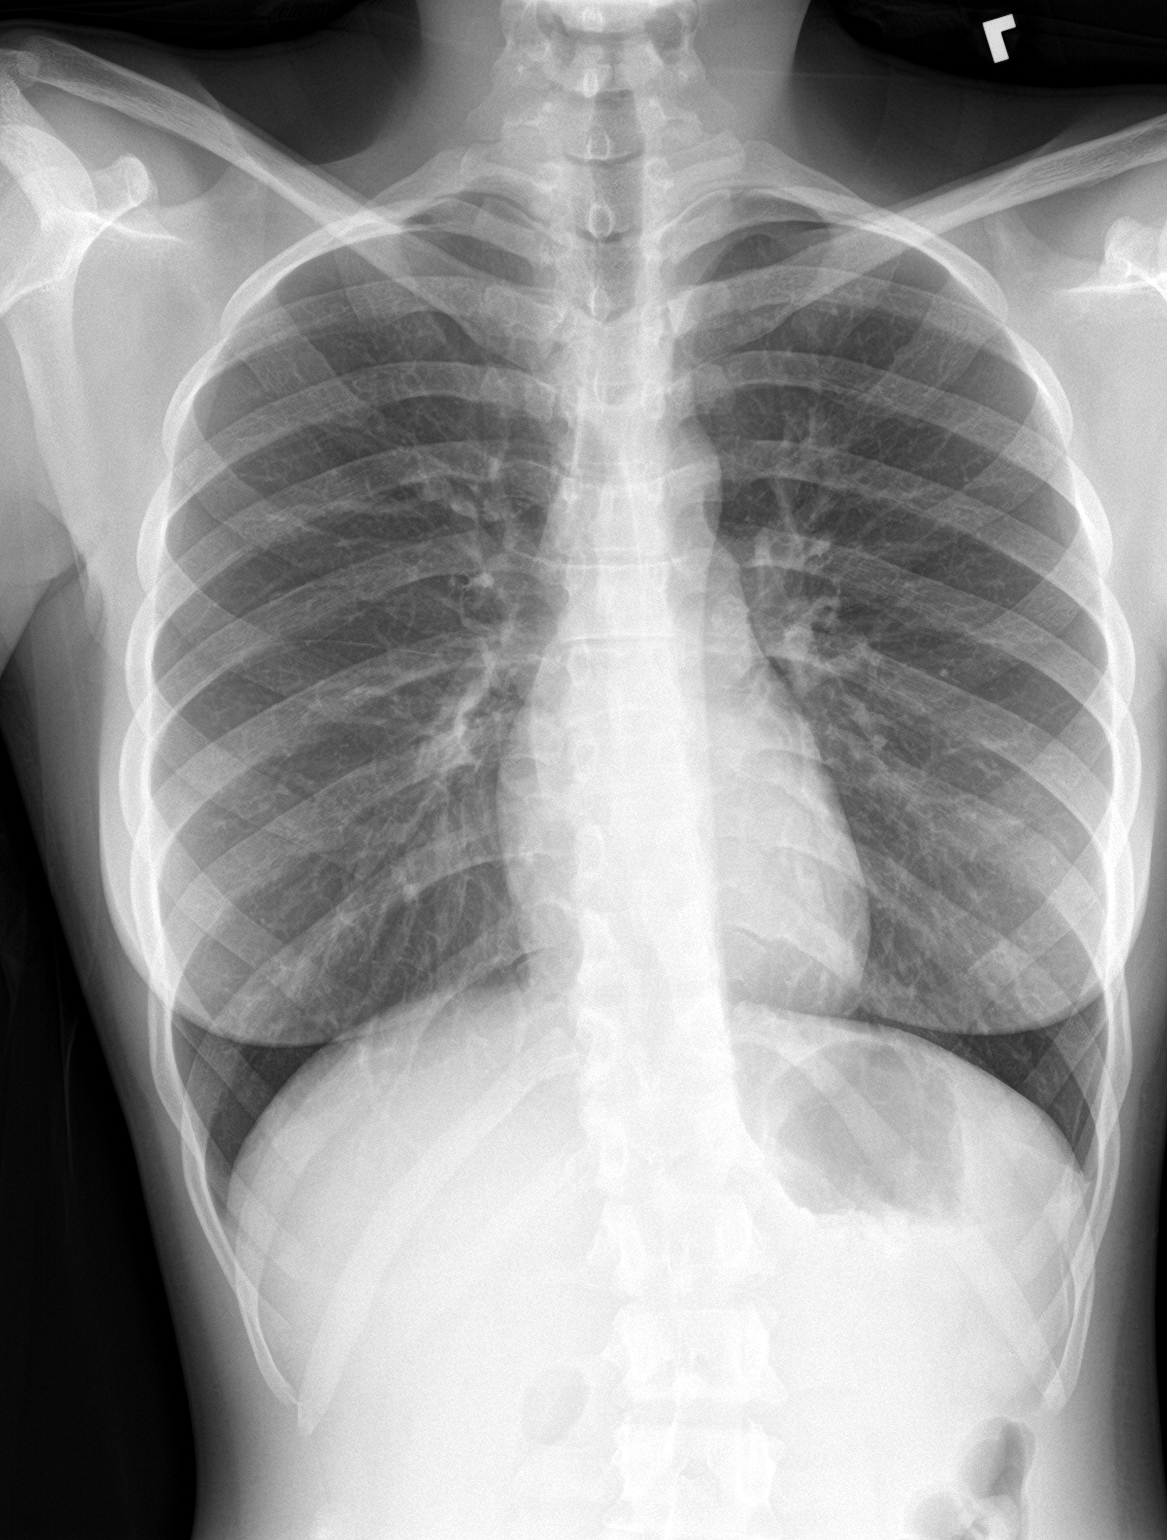

[chest lat]
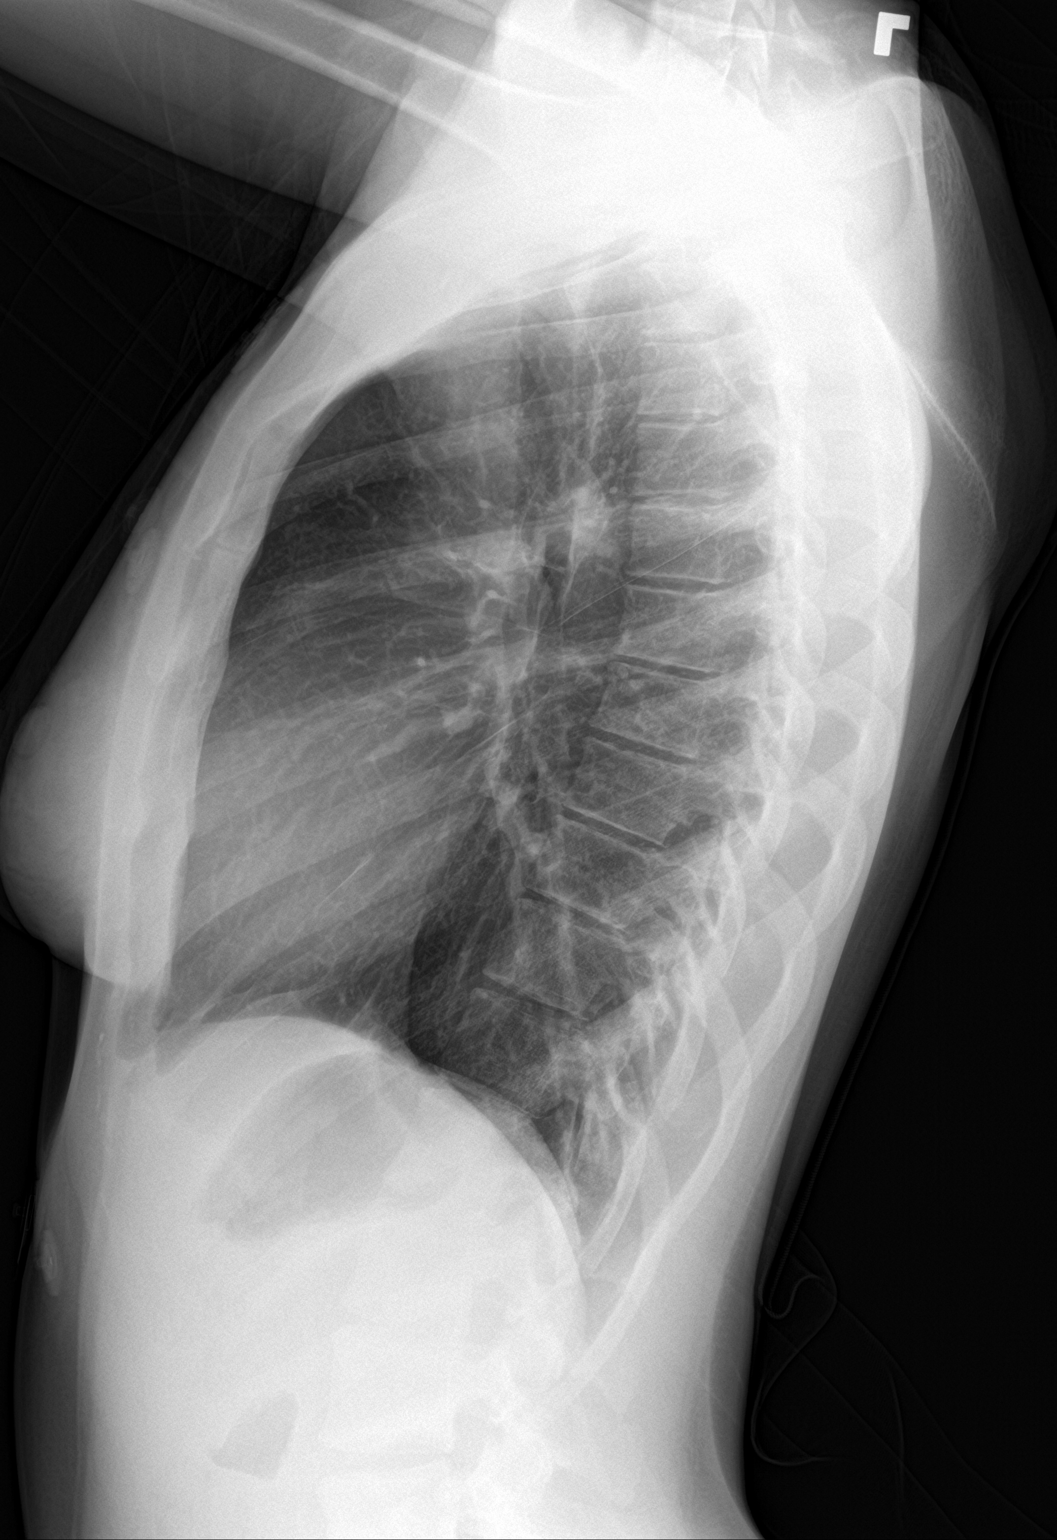

[2 of 2 positions shown; findings below may reference images not displayed]

FINDINGS: The heart size and mediastinal contours are within normal limits.
Both lungs are clear. The visualized skeletal structures are
unremarkable.
IMPRESSION: No active cardiopulmonary disease.

## 2023-08-12 ENCOUNTER — Telehealth: Payer: Self-pay | Admitting: Obstetrics and Gynecology

## 2023-08-12 ENCOUNTER — Encounter: Payer: Self-pay | Admitting: Obstetrics and Gynecology

## 2023-08-12 ENCOUNTER — Other Ambulatory Visit (HOSPITAL_COMMUNITY)
Admission: RE | Admit: 2023-08-12 | Discharge: 2023-08-12 | Disposition: A | Discharge status: Home / Self Care | Source: Ambulatory Visit | Attending: Obstetrics and Gynecology | Admitting: Obstetrics and Gynecology

## 2023-08-12 ENCOUNTER — Ambulatory Visit: Admitting: Obstetrics and Gynecology

## 2023-08-12 VITALS — BP 119/71 | HR 97 | Wt 119.2 lb

## 2023-08-12 DIAGNOSIS — Z0000 Encounter for general adult medical examination without abnormal findings: Secondary | ICD-10-CM | POA: Insufficient documentation

## 2023-08-12 DIAGNOSIS — Z113 Encounter for screening for infections with a predominantly sexual mode of transmission: Secondary | ICD-10-CM | POA: Diagnosis not present

## 2023-08-12 DIAGNOSIS — Z1331 Encounter for screening for depression: Secondary | ICD-10-CM

## 2023-08-12 DIAGNOSIS — Z131 Encounter for screening for diabetes mellitus: Secondary | ICD-10-CM | POA: Diagnosis not present

## 2023-08-12 DIAGNOSIS — Z1329 Encounter for screening for other suspected endocrine disorder: Secondary | ICD-10-CM | POA: Diagnosis not present

## 2023-08-12 DIAGNOSIS — Z01419 Encounter for gynecological examination (general) (routine) without abnormal findings: Secondary | ICD-10-CM

## 2023-08-12 DIAGNOSIS — R5383 Other fatigue: Secondary | ICD-10-CM

## 2023-08-12 NOTE — Telephone Encounter (Signed)
 Called unable to lmom for the pt to call the office to get scheduled.

## 2023-08-12 NOTE — Progress Notes (Unsigned)
 Does not wish to be on birth control.   Would like to discuss s/s of anemia and testing/ treatment. States she feels tired often. Was anemic during pregnancy  Scored 6 on PHQ

## 2023-08-12 NOTE — Progress Notes (Unsigned)
 ANNUAL EXAM Patient name: Stephanie Sloan MRN [id number]  Date of birth: 2001-01-03 Chief Complaint:   No chief complaint on file.  History of Present Illness:   Stephanie Sloan is a 23 y.o. G1P1001 being seen today for a routine annual exam.  Current complaints: ***  Menstrual concerns? {yes/no:20286}  *** Breast or nipple changes? {yes/no:20286} *** Contraception use? {yes/no:20286} *** Sexually active? {yes/no:20286} ***  Patient's last menstrual period was 07/18/2023 (approximate).   The pregnancy intention screening data noted above was reviewed. Potential methods of contraception were discussed. The patient elected to proceed with No data recorded.   Last pap No results found for: DIAGPAP, HPVHIGH, ADEQPAP   H/O abnormal pap: {yes/yes***/no:23866} Last mammogram: ***.  Last colonoscopy: ***.      08/12/2023    9:33 AM  Depression screen PHQ 2/9  Decreased Interest 1  Down, Depressed, Hopeless 1  PHQ - 2 Score 2  Altered sleeping 2  Tired, decreased energy 1  Change in appetite 1  Feeling bad or failure about yourself  0  Trouble concentrating 0  Moving slowly or fidgety/restless 0  Suicidal thoughts 0  PHQ-9 Score 6         No data to display           Review of Systems:   Pertinent items are noted in HPI Denies any headaches, blurred vision, fatigue, shortness of breath, chest pain, abdominal pain, abnormal vaginal discharge/itching/odor/irritation, problems with periods, bowel movements, urination, or intercourse unless otherwise stated above. Pertinent History Reviewed:  Reviewed past medical,surgical, social and family history.  Reviewed problem list, medications and allergies. Physical Assessment:   Vitals:   08/12/23 0918  BP: 119/71  Pulse: 97  Weight: 119 lb 3.2 oz (54.1 kg)  Body mass index is 19.84 kg/m.        Physical Examination:   General appearance - well appearing, and in no distress  Mental status - alert, oriented to  person, place, and time  Psych:  She has a normal mood and affect  Skin - warm and dry, normal color, no suspicious lesions noted  Chest - effort normal, all lung fields clear to auscultation bilaterally  Heart - normal rate and regular rhythm  Breasts - breasts appear normal, no suspicious masses, no skin or nipple changes or  axillary nodes  Abdomen - soft, nontender, nondistended, no masses or organomegaly  Pelvic -  VULVA: normal appearing vulva with no masses, tenderness or lesions   VAGINA: normal appearing vagina with normal color and discharge, no lesions   CERVIX: normal appearing cervix without discharge or lesions, no CMT  Thin prep pap is {Desc; done/not:10129} *** HR HPV cotesting  UTERUS: uterus is felt to be normal size, shape, consistency and nontender   ADNEXA: No adnexal masses or tenderness noted.  Extremities:  No swelling or varicosities noted  Chaperone present for exam  No results found for this or any previous visit (from the past 24 hours).    Assessment & Plan:  1. Annual physical exam (Primary) *** - Cytology - PAP( [facility])  2. Screen for STD (sexually transmitted disease) *** - Cervicovaginal ancillary only( [facility]) - Hepatitis B Surface AntiGEN - Hepatitis C Antibody - RPR - HIV antibody (with reflex)       Orders Placed This Encounter  Procedures   Hepatitis B Surface AntiGEN   Hepatitis C Antibody   RPR   HIV antibody (with reflex)    Meds: No orders of the  defined types were placed in this encounter.   Follow-up: No follow-ups on file.  Kiki Pelton, MD 08/12/2023 9:43 AM

## 2023-08-15 ENCOUNTER — Telehealth: Payer: Self-pay | Admitting: Obstetrics and Gynecology

## 2023-08-15 ENCOUNTER — Ambulatory Visit: Payer: Self-pay | Admitting: Obstetrics and Gynecology

## 2023-08-15 ENCOUNTER — Emergency Department (HOSPITAL_COMMUNITY)
Admission: EM | Admit: 2023-08-15 | Discharge: 2023-08-16 | Disposition: A | Discharge status: Home / Self Care | Attending: Emergency Medicine | Admitting: Emergency Medicine

## 2023-08-15 ENCOUNTER — Encounter (HOSPITAL_COMMUNITY): Payer: Self-pay

## 2023-08-15 ENCOUNTER — Other Ambulatory Visit: Payer: Self-pay

## 2023-08-15 DIAGNOSIS — D649 Anemia, unspecified: Secondary | ICD-10-CM | POA: Insufficient documentation

## 2023-08-15 DIAGNOSIS — B9689 Other specified bacterial agents as the cause of diseases classified elsewhere: Secondary | ICD-10-CM

## 2023-08-15 DIAGNOSIS — I10 Essential (primary) hypertension: Secondary | ICD-10-CM | POA: Insufficient documentation

## 2023-08-15 LAB — COMPREHENSIVE METABOLIC PANEL WITH GFR
ALT: 15 U/L (ref 0–44)
AST: 21 U/L (ref 15–41)
Albumin: 3.9 g/dL (ref 3.5–5.0)
Alkaline Phosphatase: 63 U/L (ref 38–126)
Anion gap: 8 (ref 5–15)
BUN: 5 mg/dL — ABNORMAL LOW (ref 6–20)
CO2: 23 mmol/L (ref 22–32)
Calcium: 9.2 mg/dL (ref 8.9–10.3)
Chloride: 104 mmol/L (ref 98–111)
Creatinine, Ser: 0.72 mg/dL (ref 0.44–1.00)
GFR, Estimated: >60 mL/min (ref >60)
Glucose, Bld: 103 mg/dL — ABNORMAL HIGH (ref 70–99)
Potassium: 3.5 mmol/L (ref 3.5–5.1)
Sodium: 135 mmol/L (ref 135–145)
Total Bilirubin: 0.7 mg/dL (ref 0.0–1.2)
Total Protein: 7.6 g/dL (ref 6.5–8.1)

## 2023-08-15 LAB — CBC
HCT: 25.6 % — ABNORMAL LOW (ref 36.0–46.0)
Hematocrit: 25.6 % — ABNORMAL LOW (ref 34.0–46.6)
Hemoglobin: 6.5 g/dL — CL (ref 11.1–15.9)
Hemoglobin: 6.5 g/dL — CL (ref 12.0–15.0)
MCH: 17.3 pg — ABNORMAL LOW (ref 26.6–33.0)
MCH: 16.9 pg — ABNORMAL LOW (ref 26.0–34.0)
MCHC: 25.4 g/dL — ABNORMAL LOW (ref 31.5–35.7)
MCHC: 25.4 g/dL — ABNORMAL LOW (ref 30.0–36.0)
MCV: 68 fL — ABNORMAL LOW (ref 79–97)
MCV: 66.7 fL — ABNORMAL LOW (ref 80.0–100.0)
Platelets: 317 10*3/uL (ref 150–450)
Platelets: 327 10*3/uL (ref 150–400)
RBC: 3.76 x10E6/uL — ABNORMAL LOW (ref 3.77–5.28)
RBC: 3.84 MIL/uL — ABNORMAL LOW (ref 3.87–5.11)
RDW: 18.8 % — ABNORMAL HIGH (ref 11.7–15.4)
RDW: 20.7 % — ABNORMAL HIGH (ref 11.5–15.5)
WBC: 5 10*3/uL (ref 3.4–10.8)
WBC: 4.7 10*3/uL (ref 4.0–10.5)
nRBC: 0 % (ref 0.0–0.2)

## 2023-08-15 LAB — CERVICOVAGINAL ANCILLARY ONLY
Bacterial Vaginitis (gardnerella): POSITIVE — AB
Candida Glabrata: NEGATIVE
Candida Vaginitis: NEGATIVE
Chlamydia: NEGATIVE
Comment: NEGATIVE
Comment: NEGATIVE
Comment: NEGATIVE
Comment: NEGATIVE
Comment: NEGATIVE
Comment: NORMAL
Neisseria Gonorrhea: NEGATIVE
Trichomonas: NEGATIVE

## 2023-08-15 LAB — HCG, SERUM, QUALITATIVE: Preg, Serum: NEGATIVE

## 2023-08-15 LAB — PREPARE RBC (CROSSMATCH)

## 2023-08-15 LAB — HEMOGLOBIN A1C
Est. average glucose Bld gHb Est-mCnc: 97 mg/dL
Hgb A1c MFr Bld: 5 % (ref 4.8–5.6)

## 2023-08-15 LAB — RPR: RPR Ser Ql: NONREACTIVE

## 2023-08-15 LAB — CYTOLOGY - PAP
Adequacy: ABSENT
Diagnosis: NEGATIVE

## 2023-08-15 LAB — HEPATITIS B SURFACE ANTIGEN: Hepatitis B Surface Ag: NEGATIVE

## 2023-08-15 LAB — HIV ANTIBODY (ROUTINE TESTING W REFLEX): HIV Screen 4th Generation wRfx: NONREACTIVE

## 2023-08-15 LAB — HEPATITIS C ANTIBODY: Hep C Virus Ab: NONREACTIVE

## 2023-08-15 LAB — TSH RFX ON ABNORMAL TO FREE T4: TSH: 2.55 u[IU]/mL (ref 0.450–4.500)

## 2023-08-15 MED ORDER — SODIUM CHLORIDE 0.9% IV SOLUTION: Freq: Once | INTRAVENOUS | Status: AC

## 2023-08-15 MED ORDER — METRONIDAZOLE 0.75 % VA GEL: 1.0000 | Freq: Every day | VAGINAL | 1 refills | Status: AC

## 2023-08-15 NOTE — ED Triage Notes (Signed)
 Pt sent by her OBGYN due to hgb of 6.0, pt has hx of anemia when she was pregnant and received blood then.  Pt c.o feeling tired

## 2023-08-15 NOTE — Telephone Encounter (Signed)
 Called pt lmom for pt to return call to office. Referral for [facility] 08/12/23

## 2023-08-15 NOTE — ED Provider Notes (Signed)
 [place] EMERGENCY DEPARTMENT AT [facility] Provider Note   CSN: [id number] Arrival date & time: 08/15/23  1640     Patient presents with: Anemia   Stephanie Sloan is a 23 y.o. female.    Anemia  Patient presents with low hemoglobin.  Medical history includes anemia and hypertension in pregnancy.  She was seen by OB/GYN for annual exam 3 days ago.  Lab work was obtained at the time.  Her hemoglobin was found to be low at 6.5.  She has been feeling recent fatigue.  She was sent to the ED for blood transfusion.  Patient denies any other recent symptoms besides fatigue.  She denies any blood loss other than her menstrual periods.  She is currently on her period.     Prior to Admission medications   Medication Sig Start Date End Date Taking? Authorizing Provider  Blood Pressure Monitoring (BLOOD PRESSURE KIT) DEVI 1 Device by Does not apply route as needed. Patient not taking: Reported on 08/12/2023 09/24/19   Firestone, Alicia C, MD  ferrous sulfate  325 (65 FE) MG tablet Take 1 tablet (325 mg total) by mouth daily. Patient not taking: Reported on 08/12/2023 12/05/20   Venter, Margaux, PA-C  metroNIDAZOLE  (METROGEL ) 0.75 % vaginal gel Place 1 Applicatorful vaginally at bedtime. Apply one applicatorful to vagina at bedtime for 5 days 08/15/23   Ajewole, Christana, MD    Allergies: Patient has no known allergies.    Review of Systems  Constitutional:  Positive for fatigue.  All other systems reviewed and are negative.   Updated Vital Signs BP 113/79   Pulse 97   Temp 98.9 F (37.2 C) (Oral)   Resp 15   LMP 07/18/2023 (Approximate)   SpO2 100%   Physical Exam Vitals and nursing note reviewed.  Constitutional:      General: She is not in acute distress.    Appearance: Normal appearance. She is well-developed. She is not ill-appearing, toxic-appearing or diaphoretic.  HENT:     Head: Normocephalic and atraumatic.     Right Ear: External ear normal.     Left Ear:  External ear normal.     Nose: Nose normal.     Mouth/Throat:     Mouth: Mucous membranes are moist.   Eyes:     Extraocular Movements: Extraocular movements intact.     Conjunctiva/sclera: Conjunctivae normal.    Cardiovascular:     Rate and Rhythm: Normal rate and regular rhythm.  Pulmonary:     Effort: Pulmonary effort is normal. No respiratory distress.  Abdominal:     General: There is no distension.     Palpations: Abdomen is soft.   Musculoskeletal:        General: No swelling. Normal range of motion.     Cervical back: Normal range of motion and neck supple.   Skin:    General: Skin is warm and dry.     Coloration: Skin is not jaundiced or pale.   Neurological:     General: No focal deficit present.     Mental Status: She is alert and oriented to person, place, and time.   Psychiatric:        Mood and Affect: Mood normal.        Behavior: Behavior normal.     (all labs ordered are listed, but only abnormal results are displayed) Labs Reviewed  COMPREHENSIVE METABOLIC PANEL WITH GFR - Abnormal; Notable for the following components:      Result Value  Glucose, Bld 103 (*)    BUN 5 (*)    All other components within normal limits  CBC - Abnormal; Notable for the following components:   RBC 3.84 (*)    Hemoglobin 6.5 (*)    HCT 25.6 (*)    MCV 66.7 (*)    MCH 16.9 (*)    MCHC 25.4 (*)    RDW 20.7 (*)    All other components within normal limits  HCG, SERUM, QUALITATIVE  TYPE AND SCREEN  PREPARE RBC (CROSSMATCH)    EKG: None  Radiology: No results found.   Procedures   Medications Ordered in the ED  0.9 %  sodium chloride  infusion (Manually program via Guardrails IV Fluids) (0 mLs Intravenous Stopped 08/15/23 2244)    Clinical Course as of 08/15/23 2312  Mon Aug 15, 2023  2302 Acute on chronic anemia in the setting of heavy menstrual cycle. Known IDA. Getting transfused. OP after. Has GYN to follow up with. [CC]    Clinical Course User  Index [CC] Jerral Meth, MD                                 Medical Decision Making Amount and/or Complexity of Data Reviewed Labs: ordered.  Risk Prescription drug management.   Patient presenting for symptomatic anemia.  She has had recent fatigue and was found to have a hemoglobin of 6.5 at her OB/GYN annual visit 3 days ago.  Per chart review, hemoglobin was 8.7, 2 years ago.  She does have a worsening microcytosis suggestive of iron deficiency.  On exam, patient is well-appearing.  Vital signs are normal.  She was consented for blood transfusion.  She has received transfusion in the past during pregnancy.  1 unit PRBCs was ordered.  Lab work today confirms acute on chronic anemia.  Lab work is otherwise unremarkable.  On reassessment, patient tolerate transfusion well.  Plan will be for discharge following completion.  Care of patient signed out to oncoming ED provider.     Final diagnoses:  Symptomatic anemia    ED Discharge Orders     None          Melvenia Motto, MD 08/15/23 2312

## 2023-08-15 NOTE — ED Provider Triage Note (Signed)
 Emergency Medicine Provider Triage Evaluation Note  Chevette Fee , a 23 y.o. female  was evaluated in triage.  Sent by outpatient provider for concerns over decreased hemoglobin.  CBC at her OB/GYN was 6.0.  She does complain of dizziness, increased fatigue.  Review of Systems  Positive: Dizziness, increased fatigue, shortness of breath Negative:   Physical Exam  BP 111/78 (BP Location: Right Arm)   Pulse 90   Temp 99.1 F (37.3 C) (Oral)   Resp 16   LMP 07/18/2023 (Approximate)   SpO2 100%  Gen:   Awake, no distress   Resp:  Normal effort  MSK:   Moves extremities without difficulty  Other:  Conjunctive are pale appearing  Medical Decision Making  Medically screening exam initiated at 8:56 PM.  Appropriate orders placed.  Darnella Zeiter was informed that the remainder of the evaluation will be completed by another provider, this initial triage assessment does not replace that evaluation, and the importance of remaining in the ED until their evaluation is complete.  Order sets placed, taken to room for further evaluation and likely blood transfusion.   Myriam Dorn [place], [place] 08/15/23 2100

## 2023-08-15 NOTE — ED Notes (Signed)
 Pt walked out of triage to find a charger and stated that we can call her back when we are ready. RN aware

## 2023-08-15 NOTE — ED Notes (Signed)
 Pt stated she is going home due to long wait times.

## 2023-08-15 NOTE — Addendum Note (Signed)
 Addended by: Mckynleigh Mussell O on: 08/15/2023 05:21 PM   Modules accepted: Orders

## 2023-08-15 NOTE — Telephone Encounter (Signed)
 Called patient and confirmed ID x2. Reviewed that hemoglobin 6.5 today and very low as it was in pregnancy. Recommend blood transfusion given significance of anemia. Patient reports having had severe anemia in pregnancy. Discussed iron can help with long term plan but recommend blood for immediate improvement. All questions answered and patient agreed to present to ED for blood transfusion

## 2023-08-16 LAB — TYPE AND SCREEN
ABO/RH(D): A POS
Antibody Screen: NEGATIVE
Unit division: 0

## 2023-08-16 LAB — BPAM RBC
Blood Product Expiration Date: 202507172359
ISSUE DATE / TIME: 202506232157
Unit Type and Rh: 6200

## 2023-08-16 NOTE — Discharge Instructions (Signed)
 You were seen today for symptomatic anemia from heavy menstrual bleeding and iron defeciency anemia. Please follow up with your GYN for ongoing care.

## 2023-08-18 ENCOUNTER — Ambulatory Visit: Payer: Self-pay | Admitting: Licensed Clinical Social Worker

## 2023-08-22 NOTE — BH Specialist Note (Signed)
 [facility] provided virtual link to patient on this date. [facility] contacted patient and left a message. Patient no showed for today's visit.

## 2023-08-29 ENCOUNTER — Ambulatory Visit: Admitting: Licensed Clinical Social Worker

## 2023-08-29 NOTE — BH Specialist Note (Signed)
 [facility] provided virtual link to patient on this date. [facility] contacted patient and left a message. Patient no showed for today's visit.

## 2023-09-06 NOTE — Congregational Nurse Program (Signed)
 Met with client and her son Stephanie Sloan Stephanie Sloan [phone]. They were brought here by her son's grandmother.  Client reports being un housed since May of 2024 when she lost her job. Client reports that she doesn't have a primary care doctor but she does see her GYN regularly. She states she was seen a few weeks ago and that she recently had to have a blood transfusion due to anemia.  Clent not sure why she's anemic.  Takes Iron pills but not regularly as she should. Stephanie Sloan is UTD on his vaccines and has no medical issues or medication that he takes. Client denies having any mental health issues. Plan:  Covid screening completed and was negative.  Nurse discussed her role.  Nurse encouraged client to take iron medication regularly with vitamin C. Encouraged iron rich leafy greens and other foods.  Nurse will follow up with client as needed while here at the shelter.  Stephanie Sloan D. Robynn MSN, Chartered loss adjuster Nurse [employer] [phone]

## 2023-10-11 ENCOUNTER — Ambulatory Visit

## 2023-12-09 ENCOUNTER — Ambulatory Visit

## 2024-03-01 ENCOUNTER — Ambulatory Visit

## 2024-03-29 ENCOUNTER — Other Ambulatory Visit (HOSPITAL_COMMUNITY): Admission: RE | Admit: 2024-03-29 | Source: Ambulatory Visit

## 2024-03-29 ENCOUNTER — Ambulatory Visit

## 2024-03-29 VITALS — BP 120/77 | HR 106 | Temp 98.3°F | Resp 16 | Ht 66.0 in | Wt 116.0 lb

## 2024-03-29 DIAGNOSIS — Z7689 Persons encountering health services in other specified circumstances: Secondary | ICD-10-CM

## 2024-03-29 DIAGNOSIS — N898 Other specified noninflammatory disorders of vagina: Secondary | ICD-10-CM

## 2024-03-29 DIAGNOSIS — Z13228 Encounter for screening for other metabolic disorders: Secondary | ICD-10-CM

## 2024-03-29 DIAGNOSIS — Z1329 Encounter for screening for other suspected endocrine disorder: Secondary | ICD-10-CM

## 2024-03-29 DIAGNOSIS — R5383 Other fatigue: Secondary | ICD-10-CM

## 2024-03-29 DIAGNOSIS — Z130 Encounter for screening for diseases of the blood and blood-forming organs and certain disorders involving the immune mechanism: Secondary | ICD-10-CM

## 2024-03-29 DIAGNOSIS — D649 Anemia, unspecified: Secondary | ICD-10-CM

## 2024-03-29 NOTE — Progress Notes (Unsigned)
 "    Patient ID: Sameerah Nachtigal, female    DOB: 05-Feb-2001  MRN: [id number]  CC: Establish Care   Subjective: Anayeli Arel is a 24 y.o. female with past medical history of anemia who presents to clinic to establish care. Pt reports she has been told she has anemia in the past but has not received intervention. Pt reports she experiences    Last pap: summer 2025, normal results  LMP: 2 weeks ago, 6 days, 4 pads/day    Patient Active Problem List   Diagnosis Date Noted   Gestational hypertension 12/13/2019   Vaginal delivery 12/13/2019   Third degree perineal laceration 12/13/2019   Post term pregnancy at [redacted] weeks gestation 12/12/2019   Genetic carrier 09/12/2019   Sickle cell trait in mother affecting pregnancy 06/09/2019   Anemia affecting pregnancy in third trimester 06/01/2019   Supervision of normal pregnancy, antepartum 05/24/2019     Medications Ordered Prior to Encounter[1]  Allergies[2]  Social History   Socioeconomic History   Marital status: Single    Spouse name: Not on file   Number of children: Not on file   Years of education: Not on file   Highest education level: Not on file  Occupational History   Not on file  Tobacco Use   Smoking status: Some Days    Types: Cigarettes    Passive exposure: Yes   Smokeless tobacco: Never  Vaping Use   Vaping status: Never Used  Substance and Sexual Activity   Alcohol use: Never   Drug use: Never   Sexual activity: Yes    Partners: Male    Birth control/protection: None  Other Topics Concern   Not on file  Social History Narrative   ** Merged History Encounter **       Social Drivers of Health   Tobacco Use: High Risk (03/29/2024)   Patient History    Smoking Tobacco Use: Some Days    Smokeless Tobacco Use: Never    Passive Exposure: Yes  Financial Resource Strain: Low Risk (03/29/2024)   Overall Financial Resource Strain (CARDIA)    Difficulty of Paying Living Expenses: Not very hard  Food  Insecurity: No Food Insecurity (09/06/2023)   Epic    Worried About Programme Researcher, Broadcasting/film/video in the Last Year: Never true    Ran Out of Food in the Last Year: Never true  Transportation Needs: No Transportation Needs (09/06/2023)   Epic    Lack of Transportation (Medical): No    Lack of Transportation (Non-Medical): No  Physical Activity: Inactive (03/29/2024)   Exercise Vital Sign    Days of Exercise per Week: 0 days    Minutes of Exercise per Session: 0 min  Stress: No Stress Concern Present (03/29/2024)   [employer] of Occupational Health - Occupational Stress Questionnaire    Feeling of Stress: Not at all  Social Connections: Socially Isolated (03/29/2024)   Social Connection and Isolation Panel    Frequency of Communication with Friends and Family: More than three times a week    Frequency of Social Gatherings with Friends and Family: More than three times a week    Attends Religious Services: Patient unable to answer    Active Member of Clubs or Organizations: No    Attends Banker Meetings: Never    Marital Status: Never married  Intimate Partner Violence: Not At Risk (09/06/2023)   Epic    Fear of Current or Ex-Partner: No    Emotionally Abused: No  Physically Abused: No    Sexually Abused: No  Depression (PHQ2-9): Low Risk (03/29/2024)   Depression (PHQ2-9)    PHQ-2 Score: 4  Alcohol Screen: Low Risk (03/29/2024)   Alcohol Screen    Last Alcohol Screening Score (AUDIT): 0  Housing: High Risk (09/06/2023)   Epic    Unable to Pay for Housing in the Last Year: Yes    Number of Times Moved in the Last Year: 3    Homeless in the Last Year: Yes  Utilities: At Risk (09/06/2023)   Epic    Threatened with loss of utilities: Yes  Health Literacy: Adequate Health Literacy (03/29/2024)   B1300 Health Literacy    Frequency of need for help with medical instructions: Never    Family History  Problem Relation Age of Onset   Healthy Mother    Healthy Father     Past  Surgical History:  Procedure Laterality Date   NO PAST SURGERIES      ROS: Review of Systems Negative except as stated above  PHYSICAL EXAM: BP 120/77   Pulse (!) 106   Temp 98.3 F (36.8 C) (Oral)   Resp 16   Ht 5' 6 (1.676 m)   Wt 116 lb (52.6 kg)   SpO2 96%   BMI 18.72 kg/m   Physical Exam  General: well-appearing, no acute distress Skin: no jaundice, rashes, or lesions Cardiovascular: regular heart rate and rhythm, normal S1/S2, no murmurs, gallops, or rubs, peripheral pulses 2+ bilaterally Chest: no skeletal deformity, lungs clear to auscultation bilaterally, equal breath sounds bilaterally Abdomen: soft, non-distended, non-tender to palpation, no hepatomegaly, no splenomegaly, normoactive bowel sounds Musculoskeletal: strength of upper and lower extremities equal bilaterally, normal gait Extremities: no peripheral edema   ASSESSMENT AND PLAN:  1. Encounter to establish care with new provider (Primary) ***  2. Anemia, unspecified type - CBC - Iron, TIBC and Ferritin Panel - TSH Rfx on Abnormal to Free T4  3. Vaginal discharge - Cervicovaginal ancillary only - HIV Antibody (routine testing w rflx)  4. Screening for blood disease - CBC - Iron, TIBC and Ferritin Panel  5. Screening for metabolic disorder  6. Thyroid  disorder screen  - TSH Rfx on Abnormal to Free T4     Patient was given the opportunity to ask questions.  Patient verbalized understanding of the plan and was able to repeat key elements of the plan.    No orders of the defined types were placed in this encounter.    Requested Prescriptions    No prescriptions requested or ordered in this encounter    No follow-ups on file.  Sula Cower Johanan Skorupski, PA-C      [1]  Current Outpatient Medications on File Prior to Visit  Medication Sig Dispense Refill   Blood Pressure Monitoring (BLOOD PRESSURE KIT) DEVI 1 Device by Does not apply route as needed. (Patient not taking: Reported on  08/12/2023) 1 each 0   ferrous sulfate  325 (65 FE) MG tablet Take 1 tablet (325 mg total) by mouth daily. (Patient not taking: Reported on 08/12/2023) 30 tablet 0   metroNIDAZOLE  (METROGEL ) 0.75 % vaginal gel Place 1 Applicatorful vaginally at bedtime. Apply one applicatorful to vagina at bedtime for 5 days 70 g 1   No current facility-administered medications on file prior to visit.  [2] No Known Allergies  "

## 2024-03-30 LAB — CBC
Hematocrit: 28.5 % — ABNORMAL LOW (ref 34.0–46.6)
Hemoglobin: 7.1 g/dL — ABNORMAL LOW (ref 11.1–15.9)
MCH: 17.5 pg — ABNORMAL LOW (ref 26.6–33.0)
MCHC: 24.9 g/dL — CL (ref 31.5–35.7)
MCV: 70 fL — ABNORMAL LOW (ref 79–97)
Platelets: 477 10*3/uL — ABNORMAL HIGH (ref 150–450)
RBC: 4.05 x10E6/uL (ref 3.77–5.28)
RDW: 18.3 % — ABNORMAL HIGH (ref 11.7–15.4)
WBC: 5.3 10*3/uL (ref 3.4–10.8)

## 2024-03-30 LAB — BASIC METABOLIC PANEL WITH GFR
BUN/Creatinine Ratio: 12 (ref 9–23)
BUN: 8 mg/dL (ref 6–20)
CO2: 21 mmol/L (ref 20–29)
Calcium: 9.6 mg/dL (ref 8.7–10.2)
Chloride: 105 mmol/L (ref 96–106)
Creatinine, Ser: 0.69 mg/dL (ref 0.57–1.00)
Glucose: 104 mg/dL — ABNORMAL HIGH (ref 70–99)
Potassium: 4.4 mmol/L (ref 3.5–5.2)
Sodium: 140 mmol/L (ref 134–144)
eGFR: 125 mL/min/{1.73_m2}

## 2024-03-30 LAB — CERVICOVAGINAL ANCILLARY ONLY
Bacterial Vaginitis (gardnerella): POSITIVE — AB
Candida Glabrata: NEGATIVE
Candida Vaginitis: NEGATIVE
Chlamydia: NEGATIVE
Comment: NEGATIVE
Comment: NEGATIVE
Comment: NEGATIVE
Comment: NEGATIVE
Comment: NEGATIVE
Comment: NORMAL
Neisseria Gonorrhea: NEGATIVE
Trichomonas: NEGATIVE

## 2024-03-30 LAB — IRON,TIBC AND FERRITIN PANEL
Ferritin: 4 ng/mL — ABNORMAL LOW (ref 15–150)
Iron Saturation: 4 % — CL (ref 15–55)
Iron: 17 ug/dL — ABNORMAL LOW (ref 27–159)
Total Iron Binding Capacity: 471 ug/dL — ABNORMAL HIGH (ref 250–450)
UIBC: 454 ug/dL — ABNORMAL HIGH (ref 131–425)

## 2024-03-30 LAB — TSH RFX ON ABNORMAL TO FREE T4: TSH: 1.62 u[IU]/mL (ref 0.450–4.500)

## 2024-03-30 LAB — HIV ANTIBODY (ROUTINE TESTING W REFLEX): HIV Screen 4th Generation wRfx: NONREACTIVE

## 2024-03-30 LAB — VITAMIN D 25 HYDROXY (VIT D DEFICIENCY, FRACTURES): Vit D, 25-Hydroxy: 5 ng/mL — ABNORMAL LOW (ref 30.0–100.0)

## 2024-03-30 NOTE — Telephone Encounter (Signed)
 Patient called and is aware her appt is schedule for 04/02/2024

## 2024-04-02 ENCOUNTER — Ambulatory Visit (HOSPITAL_COMMUNITY)
